# Patient Record
Sex: Female | Born: 1950 | Race: White | Hispanic: No | Marital: Married | Smoking: Never smoker
Health system: Southern US, Community
[De-identification: ages and names within clinical notes are randomized; demographics above are authoritative.]

## PROBLEM LIST (undated history)

## (undated) DIAGNOSIS — Z135 Encounter for screening for eye and ear disorders: Secondary | ICD-10-CM

## (undated) DIAGNOSIS — T7840XA Allergy, unspecified, initial encounter: Secondary | ICD-10-CM

## (undated) DIAGNOSIS — K2100 Gastro-esophageal reflux disease with esophagitis, without bleeding: Secondary | ICD-10-CM

## (undated) DIAGNOSIS — D649 Anemia, unspecified: Secondary | ICD-10-CM

## (undated) DIAGNOSIS — Z6835 Body mass index (BMI) 35.0-35.9, adult: Secondary | ICD-10-CM

## (undated) DIAGNOSIS — Z8489 Family history of other specified conditions: Secondary | ICD-10-CM

## (undated) DIAGNOSIS — E7800 Pure hypercholesterolemia, unspecified: Secondary | ICD-10-CM

## (undated) DIAGNOSIS — A6000 Herpesviral infection of urogenital system, unspecified: Secondary | ICD-10-CM

## (undated) DIAGNOSIS — M1990 Unspecified osteoarthritis, unspecified site: Secondary | ICD-10-CM

## (undated) DIAGNOSIS — E119 Type 2 diabetes mellitus without complications: Secondary | ICD-10-CM

## (undated) DIAGNOSIS — E669 Obesity, unspecified: Secondary | ICD-10-CM

## (undated) DIAGNOSIS — J309 Allergic rhinitis, unspecified: Secondary | ICD-10-CM

## (undated) DIAGNOSIS — I10 Essential (primary) hypertension: Secondary | ICD-10-CM

## (undated) HISTORY — PX: TUBAL LIGATION: SHX77

## (undated) HISTORY — DX: Encounter for screening for eye and ear disorders: Z13.5

## (undated) HISTORY — DX: Anemia, unspecified: D64.9

## (undated) HISTORY — DX: Body mass index (BMI) 35.0-35.9, adult: Z68.35

## (undated) HISTORY — PX: GASTRIC BYPASS: SHX52

## (undated) HISTORY — DX: Allergy, unspecified, initial encounter: T78.40XA

## (undated) HISTORY — PX: CHOLECYSTECTOMY: SHX55

## (undated) HISTORY — DX: Obesity, unspecified: E66.9

## (undated) HISTORY — DX: Essential (primary) hypertension: I10

## (undated) HISTORY — PX: COLONOSCOPY: SHX174

## (undated) HISTORY — DX: Allergic rhinitis, unspecified: J30.9

## (undated) HISTORY — DX: Gastro-esophageal reflux disease with esophagitis, without bleeding: K21.00

## (undated) HISTORY — DX: Pure hypercholesterolemia, unspecified: E78.00

## (undated) HISTORY — PX: ABDOMINAL HYSTERECTOMY: SHX81

## (undated) HISTORY — DX: Gastro-esophageal reflux disease with esophagitis: K21.0

## (undated) HISTORY — DX: Herpesviral infection of urogenital system, unspecified: A60.00

## (undated) HISTORY — DX: Type 2 diabetes mellitus without complications: E11.9

---

## 1999-08-22 ENCOUNTER — Encounter: Payer: Self-pay | Admitting: Family Medicine

## 1999-08-22 ENCOUNTER — Encounter: Admission: RE | Admit: 1999-08-22 | Discharge: 1999-08-22 | Payer: Self-pay | Admitting: Family Medicine

## 1999-09-06 ENCOUNTER — Encounter: Payer: Self-pay | Admitting: Obstetrics and Gynecology

## 1999-09-10 ENCOUNTER — Inpatient Hospital Stay (HOSPITAL_COMMUNITY): Admission: RE | Admit: 1999-09-10 | Discharge: 1999-09-13 | Payer: Self-pay | Admitting: Obstetrics and Gynecology

## 1999-09-10 ENCOUNTER — Encounter: Payer: Self-pay | Admitting: Obstetrics and Gynecology

## 1999-09-23 ENCOUNTER — Inpatient Hospital Stay (HOSPITAL_COMMUNITY): Admission: AD | Admit: 1999-09-23 | Discharge: 1999-09-23 | Payer: Self-pay | Admitting: Obstetrics and Gynecology

## 2001-01-19 ENCOUNTER — Encounter: Payer: Self-pay | Admitting: Family Medicine

## 2001-01-19 ENCOUNTER — Encounter: Admission: RE | Admit: 2001-01-19 | Discharge: 2001-01-19 | Payer: Self-pay | Admitting: Family Medicine

## 2001-12-28 ENCOUNTER — Encounter: Admission: RE | Admit: 2001-12-28 | Discharge: 2002-03-28 | Payer: Self-pay | Admitting: Family Medicine

## 2002-02-10 ENCOUNTER — Encounter: Admission: RE | Admit: 2002-02-10 | Discharge: 2002-05-11 | Payer: Self-pay | Admitting: Family Medicine

## 2002-11-18 ENCOUNTER — Ambulatory Visit (HOSPITAL_COMMUNITY): Admission: RE | Admit: 2002-11-18 | Discharge: 2002-11-18 | Payer: Self-pay | Admitting: *Deleted

## 2004-02-26 ENCOUNTER — Emergency Department (HOSPITAL_COMMUNITY): Admission: EM | Admit: 2004-02-26 | Discharge: 2004-02-26 | Payer: Self-pay | Admitting: Emergency Medicine

## 2006-03-26 ENCOUNTER — Encounter: Admission: RE | Admit: 2006-03-26 | Discharge: 2006-03-26 | Payer: Self-pay | Admitting: Family Medicine

## 2012-05-27 ENCOUNTER — Encounter: Payer: Self-pay | Admitting: Gastroenterology

## 2012-06-07 ENCOUNTER — Encounter: Payer: Self-pay | Admitting: Gastroenterology

## 2012-06-15 ENCOUNTER — Encounter: Payer: Self-pay | Admitting: Gastroenterology

## 2012-06-15 ENCOUNTER — Ambulatory Visit (INDEPENDENT_AMBULATORY_CARE_PROVIDER_SITE_OTHER): Payer: BC Managed Care – PPO | Admitting: Gastroenterology

## 2012-06-15 VITALS — BP 118/82 | HR 69 | Ht 60.0 in | Wt 186.4 lb

## 2012-06-15 DIAGNOSIS — K921 Melena: Secondary | ICD-10-CM

## 2012-06-15 MED ORDER — MOVIPREP 100 G PO SOLR: 1.0000 | Freq: Once | ORAL | Status: DC

## 2012-06-15 NOTE — Progress Notes (Signed)
HPI: This is a   very pleasant 62 year old woman whom I am meeting for the first time today.  Routine physical recently, home stool test was POSITIVE for blood.  She sees no overt bleeding.  No significant changes in her bowels.  Has 1-2 loose BMs a day (diet related).  Probably had colonoscopy 2004 [place], she really dosean't recal it actually.  Doesn't recall any polyps or signficant findings.   Had bariatric surgery years ago, same time cholecystectomy.  Sounds like g bypass.  She thinks she has hemorrhoids.  Review of systems: Pertinent positive and negative review of systems were noted in the above HPI section. Complete review of systems was performed and was otherwise normal.    Past Medical History  Diagnosis Date  . Screening for other eye conditions   . Other genital herpes   . Body mass index 35.0-35.9, adult   . Type II or unspecified type diabetes mellitus without mention of complication, not stated as uncontrolled   . Reflux esophagitis   . Allergic rhinitis, cause unspecified   . Obesity, unspecified   . Pure hypercholesterolemia   . Essential hypertension, benign     Past Surgical History  Procedure Laterality Date  . Abdominal hysterectomy      2001  . Cholecystectomy      1986  . Gastric bypass      1986  . Cesarean section      [id number]    Current Outpatient Prescriptions  Medication Sig Dispense Refill  . amLODipine (NORVASC) 10 MG tablet Take 10 mg by mouth daily.      Marland Kitchen aspirin 81 MG chewable tablet Chew 81 mg by mouth daily.      Marland Kitchen atorvastatin (LIPITOR) 80 MG tablet Take 80 mg by mouth daily.      . fluticasone (FLONASE) 50 MCG/ACT nasal spray Place 2 sprays into the nose daily.      Marland Kitchen ibuprofen (ADVIL,MOTRIN) 200 MG tablet Take 200 mg by mouth every 6 (six) hours as needed for pain.      Marland Kitchen lisinopril (PRINIVIL,ZESTRIL) 10 MG tablet Take 10 mg by mouth daily.      . metFORMIN (GLUCOPHAGE) 1000 MG tablet Take 1,000 mg by mouth 2 (two) times  daily with a meal.      . Multiple Vitamin (MULTIVITAMIN) capsule Take 1 capsule by mouth daily.      . ranitidine (ZANTAC) 75 MG tablet Take 75 mg by mouth 2 (two) times daily.      . valACYclovir (VALTREX) 1000 MG tablet Take 1,000 mg by mouth 2 (two) times daily.      . carbonyl iron ([employer] IRON) 45 MG TABS Take by mouth.       No current facility-administered medications for this visit.    Allergies as of 06/15/2012 - Review Complete 06/15/2012  Allergen Reaction Noted  . Codeine  06/15/2012    Family History  Problem Relation Age of Onset  . Diabetes Mother   . CVA Father   . Emphysema Father   . COPD Mother     History   Social History  . Marital Status: Married    Spouse Name: N/A    Number of Children: 2  . Years of Education: N/A   Occupational History  .     Social History Main Topics  . Smoking status: Never Smoker   . Smokeless tobacco: Never Used  . Alcohol Use: Yes     Comment: rare  . Drug Use: No  .  Sexually Active: Not on file   Other Topics Concern  . Not on file   Social History Narrative  . No narrative on file       Physical Exam: BP 118/82  Pulse 69  Ht 5' (1.524 m)  Wt 186 lb 6.4 oz (84.55 kg)  BMI 36.4 kg/m2 Constitutional: generally well-appearing Psychiatric: alert and oriented x3 Eyes: extraocular movements intact Mouth: oral pharynx moist, no lesions Neck: supple no lymphadenopathy Cardiovascular: heart regular rate and rhythm Lungs: clear to auscultation bilaterally Abdomen: soft, nontender, nondistended, no obvious ascites, no peritoneal signs, normal bowel sounds Extremities: no lower extremity edema bilaterally Skin: no lesions on visible extremities    Assessment and plan: 62 y.o. female with  Hemoccult-positive stool  We will attempt to get her records from [facility] gastroenterology her previous colonoscopy. It was probably in 2004. I will schedule her for colonoscopy for Hemoccult-positive stool at her soonest  convenience. I see no reason for any further blood tests or imaging studies prior to that.

## 2012-06-15 NOTE — Patient Instructions (Addendum)
We will get records from [facility] GI (previous colonoscopy, pathology reports). You will be set up for a colonoscopy (LEC, moderate sedation) for hemocult positive stool.                                               We are excited to introduce MyChart, a new best-in-class service that provides you online access to important information in your electronic medical record. We want to make it easier for you to view your health information - all in one secure location - when and where you need it. We expect MyChart will enhance the quality of care and service we provide.  When you register for MyChart, you can:    View your test results.    Request appointments and receive appointment reminders via email.    Request medication renewals.    View your medical history, allergies, medications and immunizations.    Communicate with your physician's office through a password-protected site.    Conveniently print information such as your medication lists.  To find out if MyChart is right for you, please talk to a member of our clinical staff today. We will gladly answer your questions about this free health and wellness tool.  If you are age 61 or older and want a member of your family to have access to your record, you must provide written consent by completing a proxy form available at our office. Please speak to our clinical staff about guidelines regarding accounts for patients younger than age 12.  As you activate your MyChart account and need any technical assistance, please call the MyChart technical support line at [phone]) or email your question to [email or web address]. If you email your question(s), please include your name, a return phone number and the best time to reach you.  If you have non-urgent health-related questions, you can send a message to our office through MyChart at [email or web address]. If you have a medical emergency, call 911.  Thank you for  using MyChart as your new health and wellness resource!   MyChart licensed from [employer],  [id number]. Patents Pending.

## 2012-06-18 ENCOUNTER — Telehealth: Payer: Self-pay | Admitting: Gastroenterology

## 2012-06-18 NOTE — Telephone Encounter (Signed)
Never an [facility] patient

## 2012-07-20 ENCOUNTER — Telehealth: Payer: Self-pay | Admitting: Gastroenterology

## 2012-07-20 NOTE — Telephone Encounter (Signed)
Pt stated "Patty" had already called her and answered her questions.

## 2012-07-20 NOTE — Telephone Encounter (Signed)
Pt wanted to make sure she could have hard candy if she feels her sugar drop.  Pt was advised it is ok to have hard candy and to take her glucose level on a regular basis, pt to call with any further questions or concerns

## 2012-07-21 ENCOUNTER — Ambulatory Visit (AMBULATORY_SURGERY_CENTER): Payer: BC Managed Care – PPO | Admitting: Gastroenterology

## 2012-07-21 ENCOUNTER — Encounter: Payer: Self-pay | Admitting: Gastroenterology

## 2012-07-21 VITALS — BP 126/75 | HR 63 | Temp 98.0°F | Resp 18 | Ht 60.0 in | Wt 186.0 lb

## 2012-07-21 DIAGNOSIS — K921 Melena: Secondary | ICD-10-CM

## 2012-07-21 DIAGNOSIS — D126 Benign neoplasm of colon, unspecified: Secondary | ICD-10-CM

## 2012-07-21 MED ORDER — SODIUM CHLORIDE 0.9 % IV SOLN: 500.0000 mL | INTRAVENOUS | Status: DC

## 2012-07-21 NOTE — Op Note (Signed)
 [facility] [address] [place], [address]   COLONOSCOPY PROCEDURE REPORT  PATIENT: Ashley Collier, Ashley Collier  MR#: [id number] BIRTHDATE: Jul 07, 1950 , 61  yrs. old GENDER: Female ENDOSCOPIST: Rachael Fee, MD REFERRED XB:JYNWG Bouska, M.D. PROCEDURE DATE:  07/21/2012 PROCEDURE:   Colonoscopy with snare polypectomy ASA CLASS:   Class II INDICATIONS:FOBT positive stool. MEDICATIONS: Fentanyl 50 mcg IV, Versed 5 mg IV, and These medications were titrated to patient response per physician's verbal order  DESCRIPTION OF PROCEDURE:   After the risks benefits and alternatives of the procedure were thoroughly explained, informed consent was obtained.  A digital rectal exam revealed no abnormalities of the rectum.   The LB [id number]-[id number]  endoscope was introduced through the anus and advanced to the cecum, which was identified by both the appendix and ileocecal valve. No adverse events experienced.   The quality of the prep was good.  The instrument was then slowly withdrawn as the colon was fully examined.  COLON FINDINGS: One small polyp was found, removed and sent to pathology.  This was 3mm across, sessile, located in sigmoid segment, removed with cold snare.  The examination was otherwise normal.  Retroflexed views revealed no abnormalities. The time to cecum=1 minutes 57 seconds.  Withdrawal time=6 minutes 43 seconds. The scope was withdrawn and the procedure completed. COMPLICATIONS: There were no complications.  ENDOSCOPIC IMPRESSION: One small polyp was found, removed and sent to pathology. The examination was otherwise normal.  RECOMMENDATIONS: If the polyp(s) removed today are proven to be adenomatous (pre-cancerous) polyps, you will need a repeat colonoscopy in 5 years.  Otherwise you should continue to follow colorectal cancer screening guidelines for "routine risk" patients with colonoscopy in 10 years.  You will receive a letter within 1-2 weeks with  the results of your biopsy as well as final recommendations.  Please call my office if you have not received a letter after 3 weeks.   eSigned:  Rachael Fee, MD 07/21/2012 2:39 PM

## 2012-07-21 NOTE — Progress Notes (Signed)
Patient did not experience any of the following events: a burn prior to discharge; a fall within the facility; wrong site/side/patient/procedure/implant event; or a hospital transfer or hospital admission upon discharge from the facility. (G8907) Patient did not have preoperative order for IV antibiotic SSI prophylaxis. (G8918)  

## 2012-07-21 NOTE — Patient Instructions (Signed)
Colon polyp x 1 removed and sent to pathology. Polyp handout given. Resume current medications. Blood sugar was 91. Call us with any questions or concerns. Thank you!   YOU HAD AN ENDOSCOPIC PROCEDURE TODAY AT THE [facility]: Refer to the procedure report that was given to you for any specific questions about what was found during the examination.  If the procedure report does not answer your questions, please call your gastroenterologist to clarify.  If you requested that your care partner not be given the details of your procedure findings, then the procedure report has been included in a sealed envelope for you to review at your convenience later.  YOU SHOULD EXPECT: Some feelings of bloating in the abdomen. Passage of more gas than usual.  Walking can help get rid of the air that was put into your GI tract during the procedure and reduce the bloating. If you had a lower endoscopy (such as a colonoscopy or flexible sigmoidoscopy) you may notice spotting of blood in your stool or on the toilet paper. If you underwent a bowel prep for your procedure, then you may not have a normal bowel movement for a few days.  DIET: Your first meal following the procedure should be a light meal and then it is ok to progress to your normal diet.  A half-sandwich or bowl of soup is an example of a good first meal.  Heavy or fried foods are harder to digest and may make you feel nauseous or bloated.  Likewise meals heavy in dairy and vegetables can cause extra gas to form and this can also increase the bloating.  Drink plenty of fluids but you should avoid alcoholic beverages for 24 hours.  ACTIVITY: Your care partner should take you home directly after the procedure.  You should plan to take it easy, moving slowly for the rest of the day.  You can resume normal activity the day after the procedure however you should NOT DRIVE or use heavy machinery for 24 hours (because of the sedation medicines used during  the test).    SYMPTOMS TO REPORT IMMEDIATELY: A gastroenterologist can be reached at any hour.  During normal business hours, 8:30 AM to 5:00 PM Monday through Friday, call [phone].  After hours and on weekends, please call the GI answering service at [phone] who will take a message and have the physician on call contact you.   Following lower endoscopy (colonoscopy or flexible sigmoidoscopy):  Excessive amounts of blood in the stool  Significant tenderness or worsening of abdominal pains  Swelling of the abdomen that is new, acute  Fever of 100F or higher  Following upper endoscopy (EGD)  Vomiting of blood or coffee ground material  New chest pain or pain under the shoulder blades  Painful or persistently difficult swallowing  New shortness of breath  Fever of 100F or higher  Black, tarry-looking stools  FOLLOW UP: If any biopsies were taken you will be contacted by phone or by letter within the next 1-3 weeks.  Call your gastroenterologist if you have not heard about the biopsies in 3 weeks.  Our staff will call the home number listed on your records the next business day following your procedure to check on you and address any questions or concerns that you may have at that time regarding the information given to you following your procedure. This is a courtesy call and so if there is no answer at the home number and we have  heard from you through the emergency physician on call, we will assume that you have returned to your regular daily activities without incident.  SIGNATURES/CONFIDENTIALITY: You and/or your care partner have signed paperwork which will be entered into your electronic medical record.  These signatures attest to the fact that that the information above on your After Visit Summary has been reviewed and is understood.  Full responsibility of the confidentiality of this discharge information lies with you and/or your care-partner. 

## 2012-07-22 ENCOUNTER — Telehealth: Payer: Self-pay | Admitting: *Deleted

## 2012-07-22 ENCOUNTER — Other Ambulatory Visit: Payer: Self-pay | Admitting: Gastroenterology

## 2012-07-22 NOTE — Telephone Encounter (Signed)
  Follow up Call-  Call back number 07/21/2012  Post procedure Call Back phone  # [phone]  Permission to leave phone message Yes     Patient questions:  Do you have a fever, pain , or abdominal swelling? no Pain Score  0 *  Have you tolerated food without any problems? yes  Have you been able to return to your normal activities? no  Do you have any questions about your discharge instructions: Diet   no Medications  no Follow up visit  no  Do you have questions or concerns about your Care? no  Actions: * If pain score is 4 or above: No action needed, pain <4.  She still feels drowsy and unable to drive.  States Dr. Algis Downs her not to go to work today, secondary to sedation.  Otherwise she is feeling well.

## 2012-07-28 ENCOUNTER — Encounter: Payer: Self-pay | Admitting: Gastroenterology

## 2013-07-11 ENCOUNTER — Emergency Department (HOSPITAL_COMMUNITY): Payer: BC Managed Care – PPO

## 2013-07-11 ENCOUNTER — Emergency Department (HOSPITAL_COMMUNITY)
Admission: EM | Admit: 2013-07-11 | Discharge: 2013-07-11 | Disposition: A | Discharge status: Home / Self Care | Payer: BC Managed Care – PPO | Attending: Emergency Medicine | Admitting: Emergency Medicine

## 2013-07-11 ENCOUNTER — Encounter (HOSPITAL_COMMUNITY): Payer: Self-pay | Admitting: Emergency Medicine

## 2013-07-11 DIAGNOSIS — E7800 Pure hypercholesterolemia, unspecified: Secondary | ICD-10-CM | POA: Insufficient documentation

## 2013-07-11 DIAGNOSIS — Z8709 Personal history of other diseases of the respiratory system: Secondary | ICD-10-CM | POA: Insufficient documentation

## 2013-07-11 DIAGNOSIS — IMO0002 Reserved for concepts with insufficient information to code with codable children: Secondary | ICD-10-CM | POA: Insufficient documentation

## 2013-07-11 DIAGNOSIS — Z79899 Other long term (current) drug therapy: Secondary | ICD-10-CM | POA: Insufficient documentation

## 2013-07-11 DIAGNOSIS — I10 Essential (primary) hypertension: Secondary | ICD-10-CM | POA: Insufficient documentation

## 2013-07-11 DIAGNOSIS — Z6835 Body mass index (BMI) 35.0-35.9, adult: Secondary | ICD-10-CM | POA: Insufficient documentation

## 2013-07-11 DIAGNOSIS — Z7982 Long term (current) use of aspirin: Secondary | ICD-10-CM | POA: Insufficient documentation

## 2013-07-11 DIAGNOSIS — Z8619 Personal history of other infectious and parasitic diseases: Secondary | ICD-10-CM | POA: Insufficient documentation

## 2013-07-11 DIAGNOSIS — Z9889 Other specified postprocedural states: Secondary | ICD-10-CM | POA: Insufficient documentation

## 2013-07-11 DIAGNOSIS — Z90710 Acquired absence of both cervix and uterus: Secondary | ICD-10-CM | POA: Insufficient documentation

## 2013-07-11 DIAGNOSIS — R197 Diarrhea, unspecified: Secondary | ICD-10-CM | POA: Insufficient documentation

## 2013-07-11 DIAGNOSIS — K439 Ventral hernia without obstruction or gangrene: Secondary | ICD-10-CM | POA: Insufficient documentation

## 2013-07-11 DIAGNOSIS — E119 Type 2 diabetes mellitus without complications: Secondary | ICD-10-CM | POA: Insufficient documentation

## 2013-07-11 LAB — CBC WITH DIFFERENTIAL/PLATELET
Basophils Absolute: 0 10*3/uL (ref 0.0–0.1)
Basophils Relative: 0 % (ref 0–1)
Eosinophils Absolute: 0 10*3/uL (ref 0.0–0.7)
Eosinophils Relative: 0 % (ref 0–5)
HCT: 35.4 % — ABNORMAL LOW (ref 36.0–46.0)
Hemoglobin: 11.6 g/dL — ABNORMAL LOW (ref 12.0–15.0)
Lymphocytes Relative: 14 % (ref 12–46)
Lymphs Abs: 1.3 10*3/uL (ref 0.7–4.0)
MCH: 27 pg (ref 26.0–34.0)
MCHC: 32.8 g/dL (ref 30.0–36.0)
MCV: 82.5 fL (ref 78.0–100.0)
Monocytes Absolute: 0.3 10*3/uL (ref 0.1–1.0)
Monocytes Relative: 3 % (ref 3–12)
Neutro Abs: 7.8 10*3/uL — ABNORMAL HIGH (ref 1.7–7.7)
Neutrophils Relative %: 83 % — ABNORMAL HIGH (ref 43–77)
Platelets: 269 10*3/uL (ref 150–400)
RBC: 4.29 MIL/uL (ref 3.87–5.11)
RDW: 13.2 % (ref 11.5–15.5)
WBC: 9.4 10*3/uL (ref 4.0–10.5)

## 2013-07-11 LAB — URINALYSIS, ROUTINE W REFLEX MICROSCOPIC
Bilirubin Urine: NEGATIVE
Glucose, UA: NEGATIVE mg/dL
Hgb urine dipstick: NEGATIVE
Ketones, ur: 15 mg/dL — AB
Leukocytes, UA: NEGATIVE
Nitrite: NEGATIVE
Protein, ur: NEGATIVE mg/dL
Specific Gravity, Urine: 1.012 (ref 1.005–1.030)
Urobilinogen, UA: 0.2 mg/dL (ref 0.0–1.0)
pH: 8 (ref 5.0–8.0)

## 2013-07-11 LAB — COMPREHENSIVE METABOLIC PANEL
ALT: 21 U/L (ref 0–35)
AST: 37 U/L (ref 0–37)
Albumin: 3.8 g/dL (ref 3.5–5.2)
Alkaline Phosphatase: 82 U/L (ref 39–117)
BUN: 15 mg/dL (ref 6–23)
CO2: 21 mEq/L (ref 19–32)
Calcium: 9.4 mg/dL (ref 8.4–10.5)
Chloride: 100 mEq/L (ref 96–112)
Creatinine, Ser: 0.35 mg/dL — ABNORMAL LOW (ref 0.50–1.10)
GFR calc Af Amer: >90 mL/min (ref >90)
GFR calc non Af Amer: >90 mL/min (ref >90)
Glucose, Bld: 110 mg/dL — ABNORMAL HIGH (ref 70–99)
Potassium: 5.5 mEq/L — ABNORMAL HIGH (ref 3.7–5.3)
Sodium: 138 mEq/L (ref 137–147)
Total Bilirubin: 0.3 mg/dL (ref 0.3–1.2)
Total Protein: 6.7 g/dL (ref 6.0–8.3)

## 2013-07-11 LAB — LIPASE, BLOOD: Lipase: 28 U/L (ref 11–59)

## 2013-07-11 MED ORDER — IOHEXOL 300 MG/ML  SOLN: 80.0000 mL | Freq: Once | INTRAMUSCULAR | Status: DC | PRN

## 2013-07-11 MED ORDER — IOHEXOL 300 MG/ML  SOLN
25.0000 mL | INTRAMUSCULAR | Status: AC
  Administered 2013-07-11: 25 mL via ORAL

## 2013-07-11 NOTE — ED Notes (Signed)
Pt sent from PCP for eval of lower abd pain with some diarrhea; pt sts umbilical hernia is larger than normal; pt sent for further eval

## 2013-07-11 NOTE — ED Notes (Signed)
Pt had some lab work at PCP that they sent with her

## 2013-07-11 NOTE — ED Notes (Signed)
Pt to CT scan.

## 2013-07-11 NOTE — Discharge Instructions (Signed)
Follow up with [facility] for further evaluation of your hernia. Return to the ED with worsening or concerning symptoms. Refer to attached documents for more information.

## 2013-07-11 NOTE — ED Provider Notes (Signed)
CSN: [id number]     Arrival date & time 07/11/13  1022 History   First MD Initiated Contact with Patient 07/11/13 1046     Chief Complaint  Patient presents with  . Abdominal Pain     (Consider location/radiation/quality/duration/timing/severity/associated sxs/prior Treatment) HPI Comments: Patient is a 63 year old female with a past medical history of diabetes, hypertension, previous abdominal hysterectomy, gastric bypass, and cholecystectomy who presents with abdominal pain that started suddenly this morning. The pain is located in her periumbilical area and does not radiate. The pain is described as aching and severe. The pain started suddenly and progressively worsened since the onset. No alleviating factors. Palpation of the area makes the pain worse. The patient has tried nothing for symptoms without relief. Associated symptoms include diarrhea. Patient went to her PCP who sent her to the ED for further evaluation.  Patient denies fever, headache, nausea, vomiting, chest pain, SOB, dysuria, constipation, abnormal vaginal bleeding/discharge.      Past Medical History  Diagnosis Date  . Screening for other eye conditions   . Other genital herpes   . Body mass index 35.0-35.9, adult   . Type II or unspecified type diabetes mellitus without mention of complication, not stated as uncontrolled   . Reflux esophagitis   . Allergic rhinitis, cause unspecified   . Obesity, unspecified   . Pure hypercholesterolemia   . Essential hypertension, benign    Past Surgical History  Procedure Laterality Date  . Abdominal hysterectomy      2001  . Cholecystectomy      1986  . Gastric bypass      1986  . Cesarean section      [id number]   Family History  Problem Relation Age of Onset  . Diabetes Mother   . CVA Father   . Emphysema Father   . COPD Mother    History  Substance Use Topics  . Smoking status: Never Smoker   . Smokeless tobacco: Never Used  . Alcohol Use: Yes     Comment:  rare   OB History   Grav Para Term Preterm Abortions TAB SAB Ect Mult Living                 Review of Systems  Constitutional: Negative for fever, chills and fatigue.  HENT: Negative for trouble swallowing.   Eyes: Negative for visual disturbance.  Respiratory: Negative for shortness of breath.   Cardiovascular: Negative for chest pain and palpitations.  Gastrointestinal: Positive for abdominal pain. Negative for nausea, vomiting and diarrhea.  Genitourinary: Negative for dysuria and difficulty urinating.  Musculoskeletal: Negative for arthralgias and neck pain.  Skin: Negative for color change.  Neurological: Negative for dizziness and weakness.  Psychiatric/Behavioral: Negative for dysphoric mood.      Allergies  Codeine  Home Medications   Current Outpatient Rx  Name  Route  Sig  Dispense  Refill  . amLODipine (NORVASC) 10 MG tablet   Oral   Take 10 mg by mouth daily.         Marland Kitchen aspirin EC 81 MG tablet   Oral   Take 81 mg by mouth daily.         Marland Kitchen atorvastatin (LIPITOR) 80 MG tablet   Oral   Take 80 mg by mouth at bedtime.          . cyclobenzaprine (FLEXERIL) 10 MG tablet   Oral   Take 10 mg by mouth at bedtime as needed for muscle spasms.          Marland Kitchen  DiphenhydrAMINE HCl (ALLERGY MEDICATION PO)   Oral   Take 1 tablet by mouth daily.         . fluticasone (FLONASE) 50 MCG/ACT nasal spray   Each Nare   Place 1 spray into both nostrils daily.          Marland Kitchen lisinopril (PRINIVIL,ZESTRIL) 10 MG tablet   Oral   Take 10 mg by mouth daily.         . meloxicam (MOBIC) 15 MG tablet   Oral   Take 15 mg by mouth at bedtime.          . metFORMIN (GLUCOPHAGE) 1000 MG tablet   Oral   Take 1,000 mg by mouth 2 (two) times daily with a meal.         . Multiple Vitamin (MULTIVITAMIN) capsule   Oral   Take 1 capsule by mouth daily.         Marland Kitchen omeprazole (PRILOSEC) 20 MG capsule   Oral   Take 20 mg by mouth at bedtime.         . valACYclovir  (VALTREX) 1000 MG tablet   Oral   Take 1,000 mg by mouth 2 (two) times daily as needed (cold sores).           BP 123/66  Pulse 78  Temp(Src) 98.1 F (36.7 C) (Oral)  Resp 17  SpO2 96% Physical Exam  Nursing note and vitals reviewed. Constitutional: She is oriented to person, place, and time. She appears well-developed and well-nourished. No distress.  HENT:  Head: Normocephalic and atraumatic.  Eyes: Conjunctivae and EOM are normal.  Neck: Normal range of motion.  Cardiovascular: Normal rate and regular rhythm.  Exam reveals no gallop and no friction rub.   No murmur heard. Pulmonary/Chest: Effort normal and breath sounds normal. She has no wheezes. She has no rales. She exhibits no tenderness.  Abdominal: Soft. She exhibits no distension. There is tenderness. There is no rebound and no guarding.  Periumbilical tenderness to palpation on the right. Possible hernia palpated in the area of tenderness.   Musculoskeletal: Normal range of motion.  Neurological: She is alert and oriented to person, place, and time. Coordination normal.  Speech is goal-oriented. Moves limbs without ataxia.   Skin: Skin is warm and dry.  Psychiatric: She has a normal mood and affect. Her behavior is normal.    ED Course  Procedures (including critical care time) Labs Review Labs Reviewed  CBC WITH DIFFERENTIAL - Abnormal; Notable for the following:    Hemoglobin 11.6 (*)    HCT 35.4 (*)    Neutrophils Relative % 83 (*)    Neutro Abs 7.8 (*)    All other components within normal limits  COMPREHENSIVE METABOLIC PANEL - Abnormal; Notable for the following:    Potassium 5.5 (*)    Glucose, Bld 110 (*)    Creatinine, Ser 0.35 (*)    All other components within normal limits  URINALYSIS, ROUTINE W REFLEX MICROSCOPIC - Abnormal; Notable for the following:    Ketones, ur 15 (*)    All other components within normal limits  LIPASE, BLOOD   Imaging Review Ct Abdomen Pelvis W Contrast  07/11/2013    CLINICAL DATA:  Ventral hernia. Abdominal bulging. Periumbilical pain.  EXAM: CT ABDOMEN AND PELVIS WITH CONTRAST  TECHNIQUE: Multidetector CT imaging of the abdomen and pelvis was performed using the standard protocol following bolus administration of intravenous contrast.  CONTRAST:  80 mL of Omnipaque 300.  COMPARISON:  CT of the abdomen and pelvis 01/06/2012.  FINDINGS: Lung Bases: Several tiny pulmonary nodules are noted in the lung bases bilaterally, largest of which measures 4 mm in the left lower lobe (image 6 of series 3), which appear unchanged in retrospect compared to prior study from 01/06/2012. Severe mitral annular calcifications inferiorly.  Abdomen/Pelvis: Status post cholecystectomy. Calcification in segment 8 of the liver likely represents a calcified granuloma. No other focal hepatic lesions. The appearance of the pancreas, spleen, bilateral adrenal glands and bilateral kidneys is unremarkable. Postoperative changes of gastric bypass procedure. Atherosclerosis throughout the abdominal and pelvic vasculature, without evidence of aneurysm or dissection. No significant volume of ascites. No pneumoperitoneum. No pathologic distention of small bowel. No definite lymphadenopathy identified within the abdomen or pelvis. Status post hysterectomy. Ovaries are not confidently identified may be surgically absent or atrophic. Urinary bladder is unremarkable in appearance.  Small supraumbilical ventral hernia slightly to the right of midline containing only omental fat.  Musculoskeletal: There are no aggressive appearing lytic or blastic lesions noted in the visualized portions of the skeleton.  IMPRESSION: 1. There is a small supraumbilical ventral hernia, however, this contains only omental fat at this time. No associated bowel incarceration or obstruction is noted. 2. No acute findings in the abdomen or pelvis are otherwise identified. 3. Postoperative changes, as above. 4. Small pulmonary nodules in the  visualized lung bases bilaterally appear unchanged in retrospect compared to 01/06/2012, and measure 4 mm or less in size. These can be considered benign at this time and do not require specific imaging followup. This recommendation follows the consensus statement: Guidelines for Management of Small Pulmonary Nodules Detected on CT Scans: A Statement from the [employer] as published in Radiology 2005; 237:395-400.   Electronically Signed   By: Vinnie Langton M.D.   On: 07/11/2013 13:54     EKG Interpretation None      MDM   Final diagnoses:  Ventral hernia    1:47 PM Labs shows potassium at 5.5 likely due hemolysis and slight WBC elevation at 11.6. CT abdomen pelvis pending. Vitals stable and patient afebrile.   2:42 PM Patient's CT shows ventral hernia that is not incarcerated. Claiborne Billings, PA-C with general surgery contacted and states patient can be discharged and follow up as an outpatient. Patient has no pain at this time. Patient given return precautions. Vitals stable and patient afebrile.   Alvina Chou, PA-C 07/11/13 1455

## 2013-07-12 NOTE — ED Provider Notes (Signed)
Medical screening examination/treatment/procedure(s) were conducted as a shared visit with non-physician practitioner(s) and myself.  I personally evaluated the patient during the encounter.   EKG Interpretation None      Pt c/o crampy mid abdominal pain. No vomiting or abd distension. Had diarrheal stool earlier today. No fevers. +palp, non tender, non reducible ventral/periumbilical hernia, approx 6-7 cm diameter. No skin changes or erythema to area. Labs. Ct.  Ct results noted, gen surgery consulted. Recheck pt no pain, abd soft nt.   Mirna Mires, MD 07/12/13 [phone]

## 2013-07-19 ENCOUNTER — Ambulatory Visit (INDEPENDENT_AMBULATORY_CARE_PROVIDER_SITE_OTHER): Payer: BC Managed Care – PPO | Admitting: Surgery

## 2013-07-19 ENCOUNTER — Encounter (INDEPENDENT_AMBULATORY_CARE_PROVIDER_SITE_OTHER): Payer: Self-pay | Admitting: Surgery

## 2013-07-19 VITALS — BP 124/70 | HR 76 | Temp 97.5°F | Ht 60.0 in | Wt 167.8 lb

## 2013-07-19 DIAGNOSIS — K432 Incisional hernia without obstruction or gangrene: Secondary | ICD-10-CM | POA: Insufficient documentation

## 2013-07-19 NOTE — Progress Notes (Signed)
Patient ID: Ashley Collier, female   DOB: May 07, 1950, 63 y.o.   MRN: [id number]  Chief Complaint  Patient presents with  . ventral hernia    new pt    HPI Ashley Collier is a 63 y.o. female.  Referred by Dr. Bernerd Limbo for evaluation of ventral hernia HPI This is a 62 year old female who was recently in the emergency department for severe periumbilical pain and swelling. The patient has had a small ventral hernia around her umbilicus for the last 9 months. She was at work one morning when she noticed severe pain and swelling this area. They were able to reduce this in the emergency department. A CT scan showed that there was no bowel in the hernia. She is now referred for surgical evaluation. The patient works at [employer] at her job does require some lifting. She has been asymptomatic over the last several days.   Past Medical History  Diagnosis Date  . Screening for other eye conditions   . Other genital herpes   . Body mass index 35.0-35.9, adult   . Type II or unspecified type diabetes mellitus without mention of complication, not stated as uncontrolled   . Reflux esophagitis   . Allergic rhinitis, cause unspecified   . Obesity, unspecified   . Pure hypercholesterolemia   . Essential hypertension, benign     Past Surgical History  Procedure Laterality Date  . Abdominal hysterectomy      2001  . Cholecystectomy      1986  . Gastric bypass      1986  . Cesarean section      [id number]    Family History  Problem Relation Age of Onset  . Diabetes Mother   . CVA Father   . Emphysema Father   . COPD Mother     Social History History  Substance Use Topics  . Smoking status: Never Smoker   . Smokeless tobacco: Never Used  . Alcohol Use: Yes     Comment: rare    Allergies  Allergen Reactions  . Codeine Other (See Comments)    Hallucinations     Current Outpatient Prescriptions  Medication Sig Dispense Refill  . amLODipine (NORVASC) 10 MG tablet Take 10 mg by  mouth daily.      Marland Kitchen aspirin EC 81 MG tablet Take 81 mg by mouth daily.      Marland Kitchen atorvastatin (LIPITOR) 80 MG tablet Take 80 mg by mouth at bedtime.       . cyclobenzaprine (FLEXERIL) 10 MG tablet Take 10 mg by mouth at bedtime as needed for muscle spasms.       . DiphenhydrAMINE HCl (ALLERGY MEDICATION PO) Take 1 tablet by mouth daily.      . fluticasone (FLONASE) 50 MCG/ACT nasal spray Place 1 spray into both nostrils daily.       Marland Kitchen lisinopril (PRINIVIL,ZESTRIL) 10 MG tablet Take 10 mg by mouth daily.      . meloxicam (MOBIC) 15 MG tablet Take 15 mg by mouth at bedtime.       . metFORMIN (GLUCOPHAGE) 1000 MG tablet Take 1,000 mg by mouth 2 (two) times daily with a meal.      . Multiple Vitamin (MULTIVITAMIN) capsule Take 1 capsule by mouth daily.      Marland Kitchen omeprazole (PRILOSEC) 20 MG capsule Take 20 mg by mouth at bedtime.      . valACYclovir (VALTREX) 1000 MG tablet Take 1,000 mg by mouth 2 (two) times daily as needed (  cold sores).        No current facility-administered medications for this visit.    Review of Systems Review of Systems  Constitutional: Negative for fever, chills and unexpected weight change.  HENT: Negative for congestion, hearing loss, sore throat, trouble swallowing and voice change.   Eyes: Negative for visual disturbance.  Respiratory: Negative for cough and wheezing.   Cardiovascular: Negative for chest pain, palpitations and leg swelling.  Gastrointestinal: Positive for abdominal pain and abdominal distention. Negative for nausea, vomiting, diarrhea, constipation, blood in stool and anal bleeding.  Genitourinary: Negative for hematuria, vaginal bleeding and difficulty urinating.  Musculoskeletal: Negative for arthralgias.  Skin: Negative for rash and wound.  Neurological: Negative for seizures, syncope and headaches.  Hematological: Negative for adenopathy. Does not bruise/bleed easily.  Psychiatric/Behavioral: Negative for confusion.    Blood pressure 124/70,  pulse 76, temperature 97.5 F (36.4 C), height 5' (1.524 m), weight 167 lb 12.8 oz (76.114 kg).  Physical Exam Physical Exam WDWN in NAD HEENT:  EOMI, sclera anicteric Neck:  No masses, no thyromegaly Lungs:  CTA bilaterally; normal respiratory effort CV:  Regular rate and rhythm; no murmurs Abd:  +bowel sounds, soft, long midline incision Palpable mass to the right and just below the umbilicus Ext:  Well-perfused; no edema Skin:  Warm, dry; no sign of jaundice  Data Reviewed Ct Abdomen Pelvis W Contrast  07/11/2013   CLINICAL DATA:  Ventral hernia. Abdominal bulging. Periumbilical pain.  EXAM: CT ABDOMEN AND PELVIS WITH CONTRAST  TECHNIQUE: Multidetector CT imaging of the abdomen and pelvis was performed using the standard protocol following bolus administration of intravenous contrast.  CONTRAST:  80 mL of Omnipaque 300.  COMPARISON:  CT of the abdomen and pelvis 01/06/2012.  FINDINGS: Lung Bases: Several tiny pulmonary nodules are noted in the lung bases bilaterally, largest of which measures 4 mm in the left lower lobe (image 6 of series 3), which appear unchanged in retrospect compared to prior study from 01/06/2012. Severe mitral annular calcifications inferiorly.  Abdomen/Pelvis: Status post cholecystectomy. Calcification in segment 8 of the liver likely represents a calcified granuloma. No other focal hepatic lesions. The appearance of the pancreas, spleen, bilateral adrenal glands and bilateral kidneys is unremarkable. Postoperative changes of gastric bypass procedure. Atherosclerosis throughout the abdominal and pelvic vasculature, without evidence of aneurysm or dissection. No significant volume of ascites. No pneumoperitoneum. No pathologic distention of small bowel. No definite lymphadenopathy identified within the abdomen or pelvis. Status post hysterectomy. Ovaries are not confidently identified may be surgically absent or atrophic. Urinary bladder is unremarkable in appearance.   Small supraumbilical ventral hernia slightly to the right of midline containing only omental fat.  Musculoskeletal: There are no aggressive appearing lytic or blastic lesions noted in the visualized portions of the skeleton.  IMPRESSION: 1. There is a small supraumbilical ventral hernia, however, this contains only omental fat at this time. No associated bowel incarceration or obstruction is noted. 2. No acute findings in the abdomen or pelvis are otherwise identified. 3. Postoperative changes, as above. 4. Small pulmonary nodules in the visualized lung bases bilaterally appear unchanged in retrospect compared to 01/06/2012, and measure 4 mm or less in size. These can be considered benign at this time and do not require specific imaging followup. This recommendation follows the consensus statement: Guidelines for Management of Small Pulmonary Nodules Detected on CT Scans: A Statement from the [place] as published in Radiology 2005; 237:395-400.   Electronically Signed   By: Vinnie Langton  M.D.   On: 07/11/2013 13:54      Assessment    Ventral incisional hernia - containing only fat     Plan    Laparoscopic ventral hernia repair with mesh.  The surgical procedure has been discussed with the patient.  Potential risks, benefits, alternative treatments, and expected outcomes have been explained.  All of the patient's questions at this time have been answered.  The likelihood of reaching the patient's treatment goal is good.  The patient understand the proposed surgical procedure and wishes to proceed.         Imogene Burn. Randle Shatzer 07/19/2013, 4:07 PM

## 2013-11-13 ENCOUNTER — Ambulatory Visit (INDEPENDENT_AMBULATORY_CARE_PROVIDER_SITE_OTHER): Payer: BC Managed Care – PPO

## 2013-11-13 ENCOUNTER — Ambulatory Visit (INDEPENDENT_AMBULATORY_CARE_PROVIDER_SITE_OTHER): Payer: BC Managed Care – PPO | Admitting: Family Medicine

## 2013-11-13 VITALS — BP 128/80 | HR 89 | Temp 98.7°F | Resp 16 | Ht 60.0 in | Wt 163.4 lb

## 2013-11-13 DIAGNOSIS — M79609 Pain in unspecified limb: Secondary | ICD-10-CM

## 2013-11-13 DIAGNOSIS — M79674 Pain in right toe(s): Secondary | ICD-10-CM

## 2013-11-13 NOTE — Progress Notes (Signed)
Subjective:    Patient ID: Ashley Collier, female    DOB: 1951-02-10, 63 y.o.   MRN: [id number] This chart was scribed for Ashley Haber, MD by Steva Colder, ED Scribe. The patient was seen in room 11 at 2:21 PM.   Chief Complaint  Patient presents with  . Toe Injury    Possible broken toe, 2nd toe on right foot, very painful, x3 days    HPI HPI Comments: [name] is a 63 y.o. female who presents to the Emergency Department complaining of toe injury onset 3 days. She states that she fell and after she fell the toe was bent, she states that she pulled her toe up and then she felt pain. She states that her husband, who had a medical hx placed a splint on.  She states that she can put a shoe on , but can not walk on it.  She states that she thought she was just bruising from the fall. She states that she is able to sleep with her foot propped up. She states that she is having associated symptoms of joint pain. She states that she has tried Extra Strength Tylenol and icing her foot. She states that she has tried splinting her toe with no relief for her symptoms. She denies any other associated symptoms. She states that she works at [employer].   Patient Active Problem List   Diagnosis Date Noted  . Ventral incisional hernia 07/19/2013   Past Medical History  Diagnosis Date  . Screening for other eye conditions   . Other genital herpes   . Body mass index 35.0-35.9, adult   . Type II or unspecified type diabetes mellitus without mention of complication, not stated as uncontrolled   . Reflux esophagitis   . Allergic rhinitis, cause unspecified   . Obesity, unspecified   . Pure hypercholesterolemia   . Essential hypertension, benign   . Anemia   . Allergy    Past Surgical History  Procedure Laterality Date  . Abdominal hysterectomy      2001  . Cholecystectomy      1986  . Gastric bypass      1986  . Cesarean section      [id number]  . Tubal ligation     Allergies  Allergen  Reactions  . Codeine Other (See Comments)    Hallucinations    Prior to Admission medications   Medication Sig Start Date End Date Taking? Authorizing Provider  amLODipine (NORVASC) 10 MG tablet Take 10 mg by mouth daily.   Yes Historical Provider, MD  atorvastatin (LIPITOR) 80 MG tablet Take 80 mg by mouth at bedtime.    Yes Historical Provider, MD  cyclobenzaprine (FLEXERIL) 10 MG tablet Take 10 mg by mouth at bedtime as needed for muscle spasms.  07/04/13  Yes Historical Provider, MD  fluticasone (FLONASE) 50 MCG/ACT nasal spray Place 1 spray into both nostrils daily.    Yes Historical Provider, MD  lisinopril (PRINIVIL,ZESTRIL) 10 MG tablet Take 10 mg by mouth daily.   Yes Historical Provider, MD  meloxicam (MOBIC) 15 MG tablet Take 15 mg by mouth at bedtime.  07/08/13  Yes Historical Provider, MD  metFORMIN (GLUCOPHAGE) 1000 MG tablet Take 1,000 mg by mouth 2 (two) times daily with a meal.   Yes Historical Provider, MD  Multiple Vitamin (MULTIVITAMIN) capsule Take 1 capsule by mouth daily.   Yes Historical Provider, MD  omeprazole (PRILOSEC) 20 MG capsule Take 20 mg by mouth at bedtime.  Yes Historical Provider, MD  valACYclovir (VALTREX) 1000 MG tablet Take 1,000 mg by mouth 2 (two) times daily as needed (cold sores).    Yes Historical Provider, MD  aspirin EC 81 MG tablet Take 81 mg by mouth daily.    Historical Provider, MD  DiphenhydrAMINE HCl (ALLERGY MEDICATION PO) Take 1 tablet by mouth daily.    Historical Provider, MD      Review of Systems  Musculoskeletal: Positive for arthralgias (right second toe pain).     Objective:  Physical Exam  Nursing note and vitals reviewed. Constitutional: She is oriented to person, place, and time. She appears well-developed and well-nourished. No distress.  HENT:  Head: Normocephalic and atraumatic.  Eyes: EOM are normal.  Neck: Neck supple. No tracheal deviation present.  Cardiovascular: Normal rate.   Pulmonary/Chest: Effort normal.  No respiratory distress.  Musculoskeletal: Normal range of motion.  Ecchymosis of the second toe and the distal forefoot on dorsal aspect surface. No significant swelling.  Neurological: She is alert and oriented to person, place, and time.  Skin: Skin is warm and dry.  Psychiatric: She has a normal mood and affect. Her behavior is normal.   [facility] reading (PRIMARY) by  Dr. Joseph Art right second toe has slight irregularity distal tibial side second metatarsal..    BP 128/80  Pulse 89  Temp(Src) 98.7 F (37.1 C)  Resp 16  Ht 5' (1.524 m)  Wt 163 lb 6.4 oz (74.118 kg)  BMI 31.91 kg/m2  SpO2 99%  Assessment & Plan:  DIAGNOSTIC STUDIES: Oxygen Saturation is 99% on room air, normal by my interpretation.    COORDINATION OF CARE: 2:23 PM-Discussed treatment plan which includes X-Ray of the right foot with pt at bedside and pt agreed to plan.   1. Pain of toe of right foot     I personally performed the services described in this documentation, which was scribed in my presence. The recorded information has been reviewed and is accurate.  Pain of toe of right foot - Plan: DG Toe 2nd Right  Buddy tape Off work x 1 week  Ashley Haber, MD

## 2013-11-15 ENCOUNTER — Telehealth: Payer: Self-pay

## 2013-11-15 DIAGNOSIS — Z0271 Encounter for disability determination: Secondary | ICD-10-CM

## 2013-11-15 NOTE — Telephone Encounter (Signed)
Pt's FMLA ppw dropped off in Dr. Lenn Cal box for completion in 5-7 business days

## 2013-11-17 ENCOUNTER — Ambulatory Visit (INDEPENDENT_AMBULATORY_CARE_PROVIDER_SITE_OTHER): Payer: BC Managed Care – PPO | Admitting: Family Medicine

## 2013-11-17 VITALS — BP 120/68 | HR 87 | Temp 98.0°F | Resp 16 | Ht 59.0 in | Wt 162.3 lb

## 2013-11-17 DIAGNOSIS — S90121D Contusion of right lesser toe(s) without damage to nail, subsequent encounter: Secondary | ICD-10-CM

## 2013-11-17 DIAGNOSIS — Z5189 Encounter for other specified aftercare: Secondary | ICD-10-CM

## 2013-11-17 DIAGNOSIS — S90129A Contusion of unspecified lesser toe(s) without damage to nail, initial encounter: Secondary | ICD-10-CM

## 2013-11-17 NOTE — Telephone Encounter (Signed)
Advised pt to RTC to be sure she is able to return to work

## 2013-11-17 NOTE — Telephone Encounter (Signed)
Called patient to let her that [facility] is ready to be picked up. Patient also had a question about her return to work note. States that she is cleared to go back on the 24th. Wants to know if she has to RTC before she can go back Monday.  [phone] Jerilynn Mages)

## 2013-11-17 NOTE — Progress Notes (Signed)
Subjective:  This chart was scribed for Reginia Forts, MD by Mercy Moore, Medial Scribe. This patient was seen in room 3 and the patient's care was started at 7:18 PM.    Patient ID: Ashley Collier, female    DOB: 06/15/1950, 63 y.o.   MRN: [id number]  11/17/2013  Letter for School/Work   HPI HPI Comments: Elfrieda Espino is a 63 y.o. female who presents to the [facility] for updated letter permitting her return to work. Patient was seen on 8/16 by Dr. Joseph Art for right second toe injury originally splinted by her husband. Treatment with Tylenol. X-ray found to be negative. Injury sustained by tripping over an extension cord that wrapped around the toe. Patient reports notable deformity immediately after the injury.  Patient written out of work for one week. Patient works at [employer]. She reports significant improvement since her last visit and is ready to return to work 8/24.  Patient states that she able to put on her left shoe on without pain in her toe. Patient is ambulatory.  Pt has been buddy taping toe daily since last visit.  Denies swelling of toe.     Review of Systems  Constitutional: Negative for fever and chills.  Musculoskeletal: Negative for arthralgias and gait problem.  Neurological: Negative for weakness and numbness.    Past Medical History  Diagnosis Date  . Screening for other eye conditions   . Other genital herpes   . Body mass index 35.0-35.9, adult   . Type II or unspecified type diabetes mellitus without mention of complication, not stated as uncontrolled   . Reflux esophagitis   . Allergic rhinitis, cause unspecified   . Obesity, unspecified   . Pure hypercholesterolemia   . Essential hypertension, benign   . Anemia   . Allergy    Past Surgical History  Procedure Laterality Date  . Abdominal hysterectomy      2001  . Cholecystectomy      1986  . Gastric bypass      1986  . Cesarean section      [id number]  . Tubal ligation      Allergies  Allergen Reactions  . Codeine Other (See Comments)    Hallucinations    Current Outpatient Prescriptions  Medication Sig Dispense Refill  . amLODipine (NORVASC) 10 MG tablet Take 10 mg by mouth daily.      Marland Kitchen atorvastatin (LIPITOR) 80 MG tablet Take 80 mg by mouth at bedtime.       . cyclobenzaprine (FLEXERIL) 10 MG tablet Take 10 mg by mouth at bedtime as needed for muscle spasms.       . fluticasone (FLONASE) 50 MCG/ACT nasal spray Place 1 spray into both nostrils daily.       Marland Kitchen lisinopril (PRINIVIL,ZESTRIL) 10 MG tablet Take 10 mg by mouth daily.      . meloxicam (MOBIC) 15 MG tablet Take 15 mg by mouth at bedtime.       . metFORMIN (GLUCOPHAGE) 1000 MG tablet Take 1,000 mg by mouth 2 (two) times daily with a meal.      . Multiple Vitamin (MULTIVITAMIN) capsule Take 1 capsule by mouth daily.      Marland Kitchen omeprazole (PRILOSEC) 20 MG capsule Take 20 mg by mouth at bedtime.      . valACYclovir (VALTREX) 1000 MG tablet Take 1,000 mg by mouth 2 (two) times daily as needed (cold sores).       Marland Kitchen aspirin EC 81 MG tablet  Take 81 mg by mouth daily.      . DiphenhydrAMINE HCl (ALLERGY MEDICATION PO) Take 1 tablet by mouth daily.       No current facility-administered medications for this visit.       Objective:    BP 120/68  Pulse 87  Temp(Src) 98 F (36.7 C) (Oral)  Resp 16  Ht 4\' 11"  (1.499 m)  Wt 162 lb 4.8 oz (73.619 kg)  BMI 32.76 kg/m2  SpO2 99%  Physical Exam  Nursing note and vitals reviewed. Constitutional: She is oriented to person, place, and time. She appears well-developed and well-nourished. No distress.  HENT:  Head: Normocephalic and atraumatic.  Eyes: EOM are normal.  Neck: Neck supple.  Cardiovascular: Normal rate.   Pulmonary/Chest: Effort normal. No respiratory distress.  Musculoskeletal: Normal range of motion. She exhibits tenderness.  Ecchymosis along dorsal aspect of second right toe. Full ROM of right second toe without pain. No swelling noted.     Neurological: She is alert and oriented to person, place, and time.  Skin: Skin is warm and dry. She is not diaphoretic.  Psychiatric: She has a normal mood and affect. Her behavior is normal.       Assessment & Plan:   1. Toe contusion, right, subsequent encounter    1.  R toe contusion: Improving.  Recommend supportive shoe for the next two weeks. Only need to buddy tape toe for the next 1-2 weeks if suffering with pain with weighbearing during the day.  Work note provided to return to work.  No orders of the defined types were placed in this encounter.    No Follow-up on file.  I personally performed the services described in this documentation, which was scribed in my presence. The recorded information has been reviewed and is accurate.  Reginia Forts, M.D.  [facility] [address] [place], [place]  [address] [phone] phone [phone] fax

## 2013-11-18 ENCOUNTER — Telehealth: Payer: Self-pay | Admitting: Family Medicine

## 2013-11-18 NOTE — Telephone Encounter (Signed)
Advised pt letter has been written and faxed.

## 2013-11-18 NOTE — Telephone Encounter (Signed)
Patient was seen yesterday. States that her job needs an updated RTW note stating that patient may return without any work restrictions. Please fax to [phone] when completed. AttnMarland Kitchen Loreli Slot  [phone]

## 2013-11-22 DIAGNOSIS — Z0271 Encounter for disability determination: Secondary | ICD-10-CM

## 2014-03-16 ENCOUNTER — Emergency Department (HOSPITAL_COMMUNITY): Payer: BC Managed Care – PPO

## 2014-03-16 ENCOUNTER — Emergency Department (HOSPITAL_COMMUNITY)
Admission: EM | Admit: 2014-03-16 | Discharge: 2014-03-16 | Disposition: A | Discharge status: Home / Self Care | Payer: BC Managed Care – PPO | Attending: Emergency Medicine | Admitting: Emergency Medicine

## 2014-03-16 ENCOUNTER — Encounter (HOSPITAL_COMMUNITY): Payer: Self-pay | Admitting: Emergency Medicine

## 2014-03-16 DIAGNOSIS — R109 Unspecified abdominal pain: Secondary | ICD-10-CM

## 2014-03-16 DIAGNOSIS — Z79899 Other long term (current) drug therapy: Secondary | ICD-10-CM | POA: Insufficient documentation

## 2014-03-16 DIAGNOSIS — K219 Gastro-esophageal reflux disease without esophagitis: Secondary | ICD-10-CM | POA: Diagnosis not present

## 2014-03-16 DIAGNOSIS — E780 Pure hypercholesterolemia: Secondary | ICD-10-CM | POA: Diagnosis not present

## 2014-03-16 DIAGNOSIS — M545 Low back pain: Secondary | ICD-10-CM | POA: Insufficient documentation

## 2014-03-16 DIAGNOSIS — R0602 Shortness of breath: Secondary | ICD-10-CM | POA: Diagnosis not present

## 2014-03-16 DIAGNOSIS — I10 Essential (primary) hypertension: Secondary | ICD-10-CM | POA: Insufficient documentation

## 2014-03-16 DIAGNOSIS — E669 Obesity, unspecified: Secondary | ICD-10-CM | POA: Diagnosis not present

## 2014-03-16 DIAGNOSIS — E119 Type 2 diabetes mellitus without complications: Secondary | ICD-10-CM | POA: Diagnosis not present

## 2014-03-16 DIAGNOSIS — M5489 Other dorsalgia: Secondary | ICD-10-CM

## 2014-03-16 DIAGNOSIS — Z7951 Long term (current) use of inhaled steroids: Secondary | ICD-10-CM | POA: Diagnosis not present

## 2014-03-16 DIAGNOSIS — R1084 Generalized abdominal pain: Secondary | ICD-10-CM

## 2014-03-16 DIAGNOSIS — Z791 Long term (current) use of non-steroidal anti-inflammatories (NSAID): Secondary | ICD-10-CM | POA: Diagnosis not present

## 2014-03-16 DIAGNOSIS — Z7982 Long term (current) use of aspirin: Secondary | ICD-10-CM | POA: Diagnosis not present

## 2014-03-16 DIAGNOSIS — D649 Anemia, unspecified: Secondary | ICD-10-CM | POA: Diagnosis not present

## 2014-03-16 LAB — COMPREHENSIVE METABOLIC PANEL
ALK PHOS: 95 U/L (ref 39–117)
ALT: 20 U/L (ref 0–35)
ANION GAP: 18 — AB (ref 5–15)
AST: 25 U/L (ref 0–37)
Albumin: 4 g/dL (ref 3.5–5.2)
BILIRUBIN TOTAL: 0.3 mg/dL (ref 0.3–1.2)
BUN: 16 mg/dL (ref 6–23)
CO2: 20 meq/L (ref 19–32)
Calcium: 10.1 mg/dL (ref 8.4–10.5)
Chloride: 99 mEq/L (ref 96–112)
Creatinine, Ser: 0.67 mg/dL (ref 0.50–1.10)
GFR calc non Af Amer: >90 mL/min (ref >90)
GLUCOSE: 77 mg/dL (ref 70–99)
POTASSIUM: 4.2 meq/L (ref 3.7–5.3)
Sodium: 137 mEq/L (ref 137–147)
Total Protein: 7.1 g/dL (ref 6.0–8.3)

## 2014-03-16 LAB — CBC WITH DIFFERENTIAL/PLATELET
Basophils Absolute: 0 10*3/uL (ref 0.0–0.1)
Basophils Relative: 0 % (ref 0–1)
Eosinophils Absolute: 0.1 10*3/uL (ref 0.0–0.7)
Eosinophils Relative: 1 % (ref 0–5)
HCT: 34.8 % — ABNORMAL LOW (ref 36.0–46.0)
HEMOGLOBIN: 10.9 g/dL — AB (ref 12.0–15.0)
LYMPHS ABS: 1.9 10*3/uL (ref 0.7–4.0)
Lymphocytes Relative: 16 % (ref 12–46)
MCH: 24.2 pg — ABNORMAL LOW (ref 26.0–34.0)
MCHC: 31.3 g/dL (ref 30.0–36.0)
MCV: 77.3 fL — ABNORMAL LOW (ref 78.0–100.0)
Monocytes Absolute: 1 10*3/uL (ref 0.1–1.0)
Monocytes Relative: 8 % (ref 3–12)
NEUTROS PCT: 75 % (ref 43–77)
Neutro Abs: 8.8 10*3/uL — ABNORMAL HIGH (ref 1.7–7.7)
Platelets: 299 10*3/uL (ref 150–400)
RBC: 4.5 MIL/uL (ref 3.87–5.11)
RDW: 14.5 % (ref 11.5–15.5)
WBC: 11.7 10*3/uL — ABNORMAL HIGH (ref 4.0–10.5)

## 2014-03-16 LAB — URINALYSIS, ROUTINE W REFLEX MICROSCOPIC
Bilirubin Urine: NEGATIVE
GLUCOSE, UA: NEGATIVE mg/dL
Hgb urine dipstick: NEGATIVE
Ketones, ur: NEGATIVE mg/dL
Nitrite: NEGATIVE
PH: 6.5 (ref 5.0–8.0)
Protein, ur: NEGATIVE mg/dL
Specific Gravity, Urine: 1.005 (ref 1.005–1.030)
Urobilinogen, UA: 0.2 mg/dL (ref 0.0–1.0)

## 2014-03-16 LAB — I-STAT TROPONIN, ED: Troponin i, poc: 0 ng/mL (ref 0.00–0.08)

## 2014-03-16 LAB — URINE MICROSCOPIC-ADD ON

## 2014-03-16 LAB — LIPASE, BLOOD: Lipase: 38 U/L (ref 11–59)

## 2014-03-16 MED ORDER — SODIUM CHLORIDE 0.9 % IV BOLUS (SEPSIS)
1000.0000 mL | Freq: Once | INTRAVENOUS | Status: AC
  Administered 2014-03-16: 1000 mL via INTRAVENOUS

## 2014-03-16 MED ORDER — IOHEXOL 300 MG/ML  SOLN
25.0000 mL | Freq: Once | INTRAMUSCULAR | Status: AC | PRN
  Administered 2014-03-16: 25 mL via ORAL

## 2014-03-16 MED ORDER — IOHEXOL 300 MG/ML  SOLN
100.0000 mL | Freq: Once | INTRAMUSCULAR | Status: AC | PRN
  Administered 2014-03-16: 100 mL via INTRAVENOUS

## 2014-03-16 NOTE — ED Notes (Signed)
Pt finished drinking contrast, CT notified 

## 2014-03-16 NOTE — ED Notes (Signed)
Patient transported to CT 

## 2014-03-16 NOTE — Discharge Instructions (Signed)
Back Exercises °Back exercises help treat and prevent back injuries. The goal of back exercises is to increase the strength of your abdominal and back muscles and the flexibility of your back. These exercises should be started when you no longer have back pain. Back exercises include: °· Pelvic Tilt. Lie on your back with your knees bent. Tilt your pelvis until the lower part of your back is against the floor. Hold this position 5 to 10 sec and repeat 5 to 10 times. °· Knee to Chest. Pull first 1 knee up against your chest and hold for 20 to 30 seconds, repeat this with the other knee, and then both knees. This may be done with the other leg straight or bent, whichever feels better. °· Sit-Ups or Curl-Ups. Bend your knees 90 degrees. Start with tilting your pelvis, and do a partial, slow sit-up, lifting your trunk only 30 to 45 degrees off the floor. Take at least 2 to 3 seconds for each sit-up. Do not do sit-ups with your knees out straight. If partial sit-ups are difficult, simply do the above but with only tightening your abdominal muscles and holding it as directed. °· Hip-Lift. Lie on your back with your knees flexed 90 degrees. Push down with your feet and shoulders as you raise your hips a couple inches off the floor; hold for 10 seconds, repeat 5 to 10 times. °· Back arches. Lie on your stomach, propping yourself up on bent elbows. Slowly press on your hands, causing an arch in your low back. Repeat 3 to 5 times. Any initial stiffness and discomfort should lessen with repetition over time. °· Shoulder-Lifts. Lie face down with arms beside your body. Keep hips and torso pressed to floor as you slowly lift your head and shoulders off the floor. °Do not overdo your exercises, especially in the beginning. Exercises may cause you some mild back discomfort which lasts for a few minutes; however, if the pain is more severe, or lasts for more than 15 minutes, do not continue exercises until you see your caregiver.  Improvement with exercise therapy for back problems is slow.  °See your caregivers for assistance with developing a proper back exercise program. °Document Released: 04/24/2004 Document Revised: 06/09/2011 Document Reviewed: 01/16/2011 °ExitCare® Patient Information ©2015 [employer]. This information is not intended to replace advice given to you by your health care provider. Make sure you discuss any questions you have with your health care provider. ° °Abdominal Pain °Many things can cause belly (abdominal) pain. Most times, the belly pain is not dangerous. Many cases of belly pain can be watched and treated at home. °HOME CARE  °· Do not take medicines that help you go poop (laxatives) unless told to by your doctor. °· Only take medicine as told by your doctor. °· Eat or drink as told by your doctor. Your doctor will tell you if you should be on a special diet. °GET HELP IF: °· You do not know what is causing your belly pain. °· You have belly pain while you are sick to your stomach (nauseous) or have runny poop (diarrhea). °· You have pain while you pee or poop. °· Your belly pain wakes you up at night. °· You have belly pain that gets worse or better when you eat. °· You have belly pain that gets worse when you eat fatty foods. °· You have a fever. °GET HELP RIGHT AWAY IF:  °· The pain does not go away within 2 hours. °· You   keep throwing up (vomiting). °· The pain changes and is only in the right or left part of the belly. °· You have bloody or tarry looking poop. °MAKE SURE YOU:  °· Understand these instructions. °· Will watch your condition. °· Will get help right away if you are not doing well or get worse. °Document Released: 09/03/2007 Document Revised: 03/22/2013 Document Reviewed: 11/24/2012 °ExitCare® Patient Information ©2015 [employer]. This information is not intended to replace advice given to you by your health care provider. Make sure you discuss any questions you have with your health care  provider. ° °

## 2014-03-16 NOTE — ED Provider Notes (Signed)
CSN: [id number]     Arrival date & time 03/16/14  1502 History   First MD Initiated Contact with Patient 03/16/14 1733     Chief Complaint  Patient presents with  . Back Pain  . Abdominal Pain     (Consider location/radiation/quality/duration/timing/severity/associated sxs/prior Treatment) Patient is a 63 y.o. female presenting with abdominal pain and back pain. The history is provided by the patient and the spouse.  Abdominal Pain Pain location:  Generalized Pain quality: aching   Pain radiates to:  Does not radiate Pain severity:  Moderate Onset quality:  Gradual Duration:  6 hours Timing:  Constant Progression:  Resolved Chronicity:  New Context: not eating, not retching and not trauma   Relieved by:  Lying down Worsened by:  Palpation Associated symptoms: shortness of breath   Associated symptoms: no chest pain, no chills, no constipation (had normal BM today), no cough, no diarrhea, no dysuria, no fatigue, no fever, no nausea, no sore throat, no vaginal bleeding, no vaginal discharge and no vomiting   Risk factors: obesity   Back Pain Location:  Generalized Quality:  Aching Radiates to:  Does not radiate Pain severity:  Moderate Pain is:  Worse during the day Onset quality:  Sudden Duration:  6 hours Timing:  Constant Progression:  Resolved Chronicity:  New Context comment:  Laying down and applying ICE to back Worsened by:  Standing Associated symptoms: abdominal pain   Associated symptoms: no chest pain, no dysuria, no fever, no headaches and no weakness     Past Medical History  Diagnosis Date  . Screening for other eye conditions   . Other genital herpes   . Body mass index 35.0-35.9, adult   . Type II or unspecified type diabetes mellitus without mention of complication, not stated as uncontrolled   . Reflux esophagitis   . Allergic rhinitis, cause unspecified   . Obesity, unspecified   . Pure hypercholesterolemia   . Essential hypertension, benign    . Anemia   . Allergy    Past Surgical History  Procedure Laterality Date  . Abdominal hysterectomy      2001  . Cholecystectomy      1986  . Gastric bypass      1986  . Cesarean section      [id number]  . Tubal ligation     Family History  Problem Relation Age of Onset  . Diabetes Mother   . COPD Mother   . CVA Father   . Emphysema Father    History  Substance Use Topics  . Smoking status: Never Smoker   . Smokeless tobacco: Never Used  . Alcohol Use: Yes     Comment: rare   OB History    No data available     Review of Systems  Constitutional: Negative for fever, chills, diaphoresis, activity change, appetite change and fatigue.  HENT: Negative for facial swelling, rhinorrhea, sore throat, trouble swallowing and voice change.   Eyes: Negative for photophobia, pain and visual disturbance.  Respiratory: Positive for shortness of breath. Negative for cough, wheezing and stridor.   Cardiovascular: Negative for chest pain, palpitations and leg swelling.  Gastrointestinal: Positive for abdominal pain. Negative for nausea, vomiting, diarrhea, constipation (had normal BM today) and anal bleeding.  Endocrine: Negative.   Genitourinary: Negative for dysuria, vaginal bleeding, vaginal discharge and vaginal pain.  Musculoskeletal: Positive for back pain. Negative for myalgias, arthralgias and gait problem.  Skin: Negative.  Negative for rash.  Allergic/Immunologic: Negative.   Neurological:  Negative for dizziness, tremors, syncope, weakness and headaches.  Psychiatric/Behavioral: Negative for suicidal ideas, sleep disturbance and self-injury.  All other systems reviewed and are negative.     Allergies  Codeine  Home Medications   Prior to Admission medications   Medication Sig Start Date End Date Taking? Authorizing Provider  amLODipine (NORVASC) 10 MG tablet Take 10 mg by mouth daily.   Yes Historical Provider, MD  aspirin EC 81 MG tablet Take 81 mg by mouth daily.    Yes Historical Provider, MD  atorvastatin (LIPITOR) 80 MG tablet Take 80 mg by mouth at bedtime.    Yes Historical Provider, MD  Cholecalciferol (VITAMIN D) 2000 UNITS CAPS Take 2,000 Units by mouth every morning.   Yes Historical Provider, MD  cyclobenzaprine (FLEXERIL) 10 MG tablet Take 10 mg by mouth at bedtime as needed for muscle spasms.  07/04/13  Yes Historical Provider, MD  Ferrous Sulfate Dried (SLOW RELEASE IRON) 45 MG TBCR Take 45 mg by mouth daily.   Yes Historical Provider, MD  fluticasone (FLONASE) 50 MCG/ACT nasal spray Place 1 spray into both nostrils daily.    Yes Historical Provider, MD  lisinopril (PRINIVIL,ZESTRIL) 10 MG tablet Take 10 mg by mouth daily.   Yes Historical Provider, MD  loratadine (CLARITIN) 10 MG tablet Take 10 mg by mouth daily.   Yes Historical Provider, MD  meloxicam (MOBIC) 15 MG tablet Take 15 mg by mouth at bedtime.  07/08/13  Yes Historical Provider, MD  metFORMIN (GLUCOPHAGE) 1000 MG tablet Take 1,000 mg by mouth 2 (two) times daily with a meal.   Yes Historical Provider, MD  Multiple Vitamin (MULTIVITAMIN) capsule Take 1 capsule by mouth daily.   Yes Historical Provider, MD  omeprazole (PRILOSEC) 20 MG capsule Take 20 mg by mouth at bedtime.   Yes Historical Provider, MD  valACYclovir (VALTREX) 1000 MG tablet Take 1,000 mg by mouth 2 (two) times daily as needed (cold sores).    Yes Historical Provider, MD   BP 120/56 mmHg  Pulse 83  Temp(Src) 98 F (36.7 C) (Oral)  Resp 18  Ht 5' (1.524 m)  Wt 161 lb (73.029 kg)  BMI 31.44 kg/m2  SpO2 97% Physical Exam  Constitutional: She is oriented to person, place, and time. She appears well-developed and well-nourished. No distress.  HENT:  Head: Normocephalic and atraumatic.  Right Ear: External ear normal.  Left Ear: External ear normal.  Mouth/Throat: Oropharynx is clear and moist. No oropharyngeal exudate.  Eyes: Conjunctivae and EOM are normal. Pupils are equal, round, and reactive to light. No scleral  icterus.  Neck: Normal range of motion. Neck supple. No JVD present. No tracheal deviation present. No thyromegaly present.  Cardiovascular: Normal rate, regular rhythm and intact distal pulses.  Exam reveals no gallop and no friction rub.   No murmur heard. Pulmonary/Chest: Effort normal and breath sounds normal. No respiratory distress. She has no wheezes. She has no rales.  Abdominal: Soft. Bowel sounds are normal. She exhibits mass (pulsitle mass in abdomen felt on deep palpation.). She exhibits no distension. There is tenderness (mild periumbilical tenderness). There is no rebound and no guarding.  Musculoskeletal: Normal range of motion. She exhibits no edema or tenderness (No back tenderness to palpation).  Neurological: She is alert and oriented to person, place, and time. No cranial nerve deficit. She exhibits normal muscle tone. Coordination normal.  5/5 strength in all 4 extremities. Normal Gait.   Skin: Skin is warm and dry. She is not diaphoretic. No  pallor.  Psychiatric: She has a normal mood and affect. She expresses no homicidal and no suicidal ideation. She expresses no suicidal plans and no homicidal plans.  Nursing note and vitals reviewed.   ED Course  Procedures (including critical care time) Labs Review Labs Reviewed  CBC WITH DIFFERENTIAL - Abnormal; Notable for the following:    WBC 11.7 (*)    Hemoglobin 10.9 (*)    HCT 34.8 (*)    MCV 77.3 (*)    MCH 24.2 (*)    Neutro Abs 8.8 (*)    All other components within normal limits  COMPREHENSIVE METABOLIC PANEL - Abnormal; Notable for the following:    Anion gap 18 (*)    All other components within normal limits  URINALYSIS, ROUTINE W REFLEX MICROSCOPIC - Abnormal; Notable for the following:    Leukocytes, UA TRACE (*)    All other components within normal limits  LIPASE, BLOOD  URINE MICROSCOPIC-ADD ON  I-STAT TROPOININ, ED    Imaging Review Ct Abdomen Pelvis W Contrast  03/16/2014   CLINICAL DATA:   Diffuse abdominal pain. Onset of symptoms this morning. Initial encounter.  EXAM: CT ABDOMEN AND PELVIS WITH CONTRAST  TECHNIQUE: Multidetector CT imaging of the abdomen and pelvis was performed using the standard protocol following bolus administration of intravenous contrast.  CONTRAST:  143mL OMNIPAQUE IOHEXOL 300 MG/ML  SOLN  COMPARISON:  07/11/2013.  FINDINGS: Musculoskeletal: No aggressive osseous lesions. Lower thoracic spondylosis.  Lung Bases: Mild respiratory motion artifact. Lingular scarring or atelectasis. Unchanged small basilar pulmonary nodules. No follow-up required. Coronary artery atherosclerosis is present. If office based assessment of coronary risk factors has not been performed, it is now recommended. Calcification around the mitral annulus.  Liver:   Old granulomatous disease.  Spleen:  Normal.  Gallbladder:  Cholecystectomy.  Common bile duct:  Normal status postcholecystectomy.  Pancreas:  Normal.  Adrenal glands:  Normal bilaterally.  Kidneys: There is a faint area of decreased enhancement on the delayed images in the RIGHT upper renal pole. This could represent a small focus of pyelonephritis in the appropriate clinical setting and correlation with urinalysis is recommended. LEFT kidney appears normal. Both ureters appear normal. No renal calculi.  Stomach: Gastric bypass. No complicating features are identified. No obstruction to suggest an internal hernia.  Small bowel:  Normal.  Colon:   Colonic diverticulosis.  Pelvic Genitourinary:  Hysterectomy.  Urinary bladder normal.  Peritoneum: No free fluid or free air.  Vasculature: Atherosclerosis.  Body Wall: Scarring and muscular atrophy. There is a small fat containing ventral hernia that appears to contained omentum when viewed on the sagittal images.  IMPRESSION: 1. Uncomplicated gastric bypass. 2. Faint area of decreased enhancement in the RIGHT upper renal pole could represent a small focus of pyelonephritis. Correlation with  urinalysis recommended. 3. Cholecystectomy. 4. Hysterectomy. 5. Omentum containing ventral hernia without complicating features. 6. Unchanged tiny benign pulmonary nodules.   Electronically Signed   By: Dereck Ligas M.D.   On: 03/16/2014 20:28     EKG Interpretation None      MDM   Final diagnoses:  Abdominal pain  Other back pain    The patient is a 63 y.o. F with hx of ventral hernia who presents with generalized abdominal pain and back pain "all up and down my spine" that started at 8 am today while at work but completely resolved upon going home, laying down, and applying ice. Patient AFVSS. Denies any pain or other symptoms while in the  ED. Labs unremarkable. CT abd/pevis redemonstrates known Vental hernia which is not incarcerated and shows no other acute findings. Patient is reexamined and continues to be pain free while in ED.   I estimate there is LOW risk for ACUTE APPENDICITIS, BOWEL OBSTRUCTION, CHOLECYSTITIS, DIVERTICULITIS, INCARCERATED HERNIA, MESENTERIC ISCHEMIA, PANCREATITIS, or PERFORATED BOWEL or ULCER, thus I consider the discharge disposition reasonable. Also, there is no evidence or peritonitis, sepsis or toxicity. We have discussed the diagnosis and risks and agree with discharging home to follow-up with their primary doctor. We also discussed returning to the Emergency Department immediately if new or worsening symptoms occur and have discussed the symptoms which are most concerning (e.g., bloody stool, fever, changing or worsening pain, vomiting) that necessitate immediate return.  Patient seen with attending, Dr. Jeanell Sparrow, who oversaw clinical decision making.     Margaretann Loveless, MD 03/16/14 [id number]  Shaune Pollack, MD 03/18/14 [id number]

## 2014-03-16 NOTE — ED Notes (Signed)
C/o back and abd pain onset this AM, no V/D, no CP/SOB, A/O X4, ambulatory and in NAD

## 2014-03-17 LAB — CBG MONITORING, ED: GLUCOSE-CAPILLARY: 106 mg/dL — AB (ref 70–99)

## 2014-12-05 ENCOUNTER — Ambulatory Visit: Payer: Self-pay | Admitting: Surgery

## 2014-12-05 NOTE — H&P (Signed)
History of Present Illness Ashley Collier. Ashley Gatti Ashley Collier; 12/05/2014 9:21 PM) Patient words: hernia.  The patient is a 64 year old female who presents with an incisional hernia. This is a 64 year old female referred by Dr. Bernerd Limbo who was evaluated last year for severe periumbilical pain and swelling. The patient has had a small ventral hernia around her umbilicus for the last two years. She was at work one morning when she noticed severe pain and swelling this area. They were able to reduce this in the emergency department. A CT scan showed that there was no bowel in the hernia. She is now referred for surgical evaluation. The patient works at [employer] at her job does require some lifting. She has been relatively asymptomatic, but has been refraining from any heavy lifting. She postponed her surgery because of financial reasons.  She has a history of open gastric bypass surgery/ cholecystectomy in [place]: Diffuse abdominal pain. Onset of symptoms this morning. Initial encounter.  EXAM: CT ABDOMEN AND PELVIS WITH CONTRAST  TECHNIQUE: Multidetector CT imaging of the abdomen and pelvis was performed using the standard protocol following bolus administration of intravenous contrast.  CONTRAST: 165mL OMNIPAQUE IOHEXOL 300 MG/ML SOLN  COMPARISON: 07/11/2013.  FINDINGS: Musculoskeletal: No aggressive osseous lesions. Lower thoracic spondylosis.  Lung Bases: Mild respiratory motion artifact. Lingular scarring or atelectasis. Unchanged small basilar pulmonary nodules. No follow-up required. Coronary artery atherosclerosis is present. If office based assessment of coronary risk factors has not been performed, it is now recommended. Calcification around the mitral annulus.  Liver: Old granulomatous disease.  Spleen: Normal.  Gallbladder: Cholecystectomy.  Common bile duct: Normal status postcholecystectomy.  Pancreas: Normal.  Adrenal glands: Normal  bilaterally.  Kidneys: There is a faint area of decreased enhancement on the delayed images in the RIGHT upper renal pole. This could represent a small focus of pyelonephritis in the appropriate clinical setting and correlation with urinalysis is recommended. LEFT kidney appears normal. Both ureters appear normal. No renal calculi.  Stomach: Gastric bypass. No complicating features are identified. No obstruction to suggest an internal hernia.  Small bowel: Normal.  Colon: Colonic diverticulosis.  Pelvic Genitourinary: Hysterectomy. Urinary bladder normal.  Peritoneum: No free fluid or free air.  Vasculature: Atherosclerosis.  Body Wall: Scarring and muscular atrophy. There is a small fat containing ventral hernia that appears to contained omentum when viewed on the sagittal images.  IMPRESSION: 1. Uncomplicated gastric bypass. 2. Faint area of decreased enhancement in the RIGHT upper renal pole could represent a small focus of pyelonephritis. Correlation with urinalysis recommended. 3. Cholecystectomy. 4. Hysterectomy. 5. Omentum containing ventral hernia without complicating features. 6. Unchanged tiny benign pulmonary nodules.   Electronically Signed By: Ashley Collier M.D. On: 03/16/2014 20:28  Other Problems Ashley Collier, Ashley Collier; 12/05/2014 3:11 PM) Bladder Problems Depression Gastroesophageal Reflux Disease Hemorrhoids High blood pressure Hypercholesterolemia  Past Surgical History Ashley Collier, Ashley Collier; 12/05/2014 3:11 PM) Cesarean Section - Multiple Gallbladder Surgery - Open Gastric Bypass Hysterectomy (not due to cancer) - Complete Resection of Stomach  Diagnostic Studies History Ashley Collier, Ashley Collier; 12/05/2014 3:11 PM) Colonoscopy 1-5 years ago Mammogram >3 years ago Pap Smear >5 years ago  Allergies Ashley Collier, Ashley Collier; 12/05/2014 3:12 PM) Codeine Phosphate *ANALGESICS - OPIOID*  Medication History (Ashley Collier, Ashley Collier; 12/05/2014 3:13  PM) AmLODIPine Besylate (10MG  Tablet, Oral) Active. Cyclobenzaprine HCl (10MG  Tablet, Oral) Active. Fluticasone Propionate (50MCG/ACT Suspension, Nasal) Active. Lisinopril (10MG  Tablet, Oral) Active. Meloxicam (15MG  Tablet, Oral) Active. MetFORMIN HCl (1000MG  Tablet,  Oral) Active. Atorvastatin Calcium (80MG  Tablet, Oral) Active. TraMADol HCl (50MG  Tablet, Oral as needed) Active. Medications Reconciled  Social History Ashley Collier, Ashley Collier; 12/05/2014 3:11 PM) Alcohol use Occasional alcohol use. Caffeine use Carbonated beverages. No drug use Tobacco use Never smoker.  Family History Ashley Collier, [place]; 12/05/2014 3:11 PM) Cerebrovascular Accident Father. Diabetes Mellitus Father, Mother. Heart Disease Mother. Hypertension Father, Mother. Melanoma Father. Respiratory Condition Father, Mother.  Pregnancy / Birth History Ashley Collier, [place]; 12/05/2014 3:11 PM) Age at menarche 20 years. Age of menopause 82-55 Gravida 2 Irregular periods     Review of Systems (Ashley Collier; 12/05/2014 3:11 PM) General Not Present- Appetite Loss, Chills, Fatigue, Fever, Night Sweats, Weight Gain and Weight Loss. Skin Not Present- Change in Wart/Mole, Dryness, Hives, Jaundice, New Lesions, Non-Healing Wounds, Rash and Ulcer. HEENT Present- Hearing Loss, Seasonal Allergies and Wears glasses/contact lenses. Not Present- Earache, Hoarseness, Nose Bleed, Oral Ulcers, Ringing in the Ears, Sinus Pain, Sore Throat, Visual Disturbances and Yellow Eyes. Respiratory Not Present- Bloody sputum, Chronic Cough, Difficulty Breathing, Snoring and Wheezing. Breast Not Present- Breast Mass, Breast Pain, Nipple Discharge and Skin Changes. Cardiovascular Not Present- Chest Pain, Difficulty Breathing Lying Down, Leg Cramps, Palpitations, Rapid Heart Rate, Shortness of Breath and Swelling of Extremities. Gastrointestinal Present- Abdominal Pain, Hemorrhoids and Indigestion. Not Present- Bloating, Bloody  Stool, Change in Bowel Habits, Chronic diarrhea, Constipation, Difficulty Swallowing, Excessive gas, Gets full quickly at meals, Nausea, Rectal Pain and Vomiting. Female Genitourinary Present- Frequency, Nocturia and Urgency. Not Present- Painful Urination and Pelvic Pain. Musculoskeletal Present- Back Pain, Joint Pain, Joint Stiffness and Muscle Pain. Not Present- Muscle Weakness and Swelling of Extremities. Neurological Present- Decreased Memory and Tingling. Not Present- Fainting, Headaches, Numbness, Seizures, Tremor, Trouble walking and Weakness. Psychiatric Not Present- Anxiety, Bipolar, Change in Sleep Pattern, Depression, Fearful and Frequent crying. Hematology Present- Easy Bruising. Not Present- Excessive bleeding, Gland problems, HIV and Persistent Infections.  Vitals (Ashley Collier Ashley Collier; 12/05/2014 3:12 PM) 12/05/2014 3:12 PM Weight: 164 lb Height: 60in Body Surface Area: 1.77 m Body Mass Index: 32.03 kg/m Temp.: 97.62F(Temporal)  Pulse: 76 (Regular)  BP: 128/82 (Sitting, Left Arm, Standard)     Physical Exam Ashley Key K. Vickey Boak Ashley Collier; 12/05/2014 9:21 PM)  The physical exam findings are as follows: Note:WDWN in NAD HEENT: EOMI, sclera anicteric Neck: No masses, no thyromegaly Lungs: CTA bilaterally; normal respiratory effort CV: Regular rate and rhythm; no murmurs Abd: +bowel sounds, soft, long midline incision Palpable mass to the right and just below the umbilicus - partially reducible when supine Ext: Well-perfused; no edema Skin: Warm, dry; no sign of jaundice    Assessment & Plan Ashley Key K. 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

## 2015-11-19 ENCOUNTER — Other Ambulatory Visit: Payer: Self-pay | Admitting: Surgery

## 2015-11-19 DIAGNOSIS — R1030 Lower abdominal pain, unspecified: Secondary | ICD-10-CM

## 2015-11-27 ENCOUNTER — Ambulatory Visit
Admission: RE | Admit: 2015-11-27 | Discharge: 2015-11-27 | Disposition: A | Discharge status: Home / Self Care | Payer: BC Managed Care – PPO | Source: Ambulatory Visit | Attending: Surgery | Admitting: Surgery

## 2015-11-27 DIAGNOSIS — R1030 Lower abdominal pain, unspecified: Secondary | ICD-10-CM

## 2015-11-27 MED ORDER — IOPAMIDOL (ISOVUE-300) INJECTION 61%
100.0000 mL | Freq: Once | INTRAVENOUS | Status: AC | PRN
  Administered 2015-11-27: 100 mL via INTRAVENOUS

## 2016-01-14 ENCOUNTER — Ambulatory Visit: Payer: Self-pay | Admitting: Surgery

## 2016-01-14 NOTE — H&P (Signed)
History of Present Illness Ashley Collier. Ashley Bostwick Collier; 01/14/2016 1:28 PM) Patient words: follow up.  The patient is a 65 year old female who presents with an abdominal wall hernia. This is a 65 yo female who has been previously evaluated for incisional hernia. She was referred by Dr. Bernerd Collier who was evaluated last year for severe periumbilical pain and swelling. The patient has had a small ventral hernia around her umbilicus for the last two years. She was at work one morning when she noticed severe pain and swelling this area. They were able to reduce this in the emergency department. A CT scan showed that there was no bowel in the hernia. She is now referred for surgical evaluation. She has been relatively asymptomatic, but has been refraining from any heavy lifting. She postponed her surgery because of financial reasons.  She has a history of open gastric bypass surgery/ cholecystectomy in 1986.  Over the last several months, she has noted some vague mild discomfort in her right suprapubic region. There is not a clear bulge in this area but she does feel some fullness when compared to the left side. She denies any urinary or symptoms. She brought this to the attention of her PCP who has referred her back to [place] for evaluation. The patient is no longer working and has refraining from any heavy lifting. She is not having any significant symptoms from her periumbilical hernia.  I obtained a CT scan that showed no sign of inguinal hernia or other etiology for her right groin pain.  CLINICAL DATA: Bilateral groin pain for several months, history of gastric bypass  EXAM: CT ABDOMEN AND PELVIS WITH CONTRAST  TECHNIQUE: Multidetector CT imaging of the abdomen and pelvis was performed using the standard protocol following bolus administration of intravenous contrast.  CONTRAST: 177mL ISOVUE-300 IOPAMIDOL (ISOVUE-300) INJECTION 61%  COMPARISON: CT abdomen pelvis of 03/16/2014, and CT  abdomen pelvis of 01/06/2012  Creatinine was obtained on site at [name] at [address]Results: Creatinine 0.4 mg/dL. GFR of 111.  FINDINGS: On images through the lung bases, the noncalcified nodules are stable. The largest nodule measures 8 mm just above the hemidiaphragm in the left lower lobe, unchanged in size. No pleural effusion is seen.  The liver enhances with no focal abnormality and no ductal dilatation is seen. Surgical clips are present from prior cholecystectomy. The pancreas is normal in size and the pancreatic duct is not dilated. The adrenal glands and the spleen are stable. Changes of gastric bypass are noted. The kidneys enhance with no calculus or mass and on delayed images, the pelvocaliceal systems are unremarkable. The focus of diminished attenuation within the posterior right kidney noted previously is not seen currently. The proximal ureters are normal in caliber. Moderate abdominal aortic atherosclerosis is present. No aneurysmal dilatation is seen.  The urinary bladder is not well distended but no abnormality is seen. The uterus has previously been resected. No adnexal lesion is seen. No fluid is noted within the pelvis. No abnormality of the colon is seen. The terminal ileum is unremarkable. The appendix is not visualized. No pelvic mass or adenopathy is seen. No groin adenopathy is noted. There is no evidence of inguinal hernia. There is prominence of the soft tissues of the lower pelvis anteriorly which may represent cellulitis. Clinical correlation is recommended. The lumbar vertebrae are normal alignment. Degenerative disc disease is present at L5- S1.  IMPRESSION: 1. No explanation for the patient's groin pain is seen. There is  however possible cellulitis of the superficial soft tissues of the lower pelvis. No adenopathy is seen. 2. Stable noncalcified lung nodules most consistent with a benign process. 3. Degenerative disc  disease at L5-S1.   Electronically Signed By: Ashley Collier M.D. On: 11/27/2015 16:47   The groin pain is slightly improved, but still present.   Allergies (Ashley Eversole, LPN; 075-GRM X33443 AM) Codeine Phosphate *ANALGESICS - OPIOID*  Medication History (Ashley Eversole, LPN; 075-GRM X33443 AM) Vitamin D3 (1000UNIT Capsule, Oral) Active. Ferrous Sulfate (325 (65 Fe)MG Tablet, Oral) Active. Omeprazole (10MG  Capsule DR, Oral) Active. Multiple Vitamin (Oral) Active. Valtrex (1GM Tablet, Oral) Active. Super B Complex Maxi (Oral) Active. Aspirin (81MG  Tablet Chewable, Oral) Active. Loratadine (10MG  (Rapid) Tablet, Oral) Active. AmLODIPine Besylate (10MG  Tablet, Oral) Active. Cyclobenzaprine HCl (10MG  Tablet, Oral) Active. Fluticasone Propionate (50MCG/ACT Suspension, Nasal) Active. Lisinopril (10MG  Tablet, Oral) Active. Meloxicam (15MG  Tablet, Oral) Active. MetFORMIN HCl (1000MG  Tablet, Oral) Active. Atorvastatin Calcium (80MG  Tablet, Oral) Active. TraMADol HCl (50MG  Tablet, Oral as needed) Active. Medications Reconciled    Vitals (Ashley Eversole LPN; 075-GRM [id number] AM) 01/14/2016 9:45 AM Weight: 178 lb Height: 60in Body Surface Area: 1.78 m Body Mass Index: 34.76 kg/m  Temp.: 98.28F(Oral)  Pulse: 101 (Regular)  BP: 122/70 (Sitting, Left Arm, Standard)      Physical Exam Ashley Key K. Otillia Cordone Collier; 01/14/2016 1:28 PM)  The physical exam findings are as follows: Note:WDWN in NAD Eyes: Pupils equal, round; sclera anicteric HENT: Oral mucosa moist; good dentition Neck: No masses palpated, no thyromegaly Lungs: CTA bilaterally; normal respiratory effort CV: Regular rate and rhythm; no murmurs; extremities well-perfused with no edema Abd: +bowel sounds, soft, non-tender, no palpable organomegaly; palpable hernia to the right of the umbilicus - not reducible Skin: Warm, dry; no sign of jaundice Psychiatric - alert and oriented x 4; calm  mood and affect    Assessment & Plan Ashley Collier; 01/14/2016 10:03 AM)  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 The surgical procedure has been discussed with the patient. Potential risks, benefits, alternative treatments, and expected outcomes have been explained. All of the patient's questions at this time have been answered. The likelihood of reaching the patient's treatment goal is good. The patient understand the proposed surgical procedure and wishes to proceed.  Ashley Collier. Georgette Dover, Collier, [facility] Surgery  General/ Trauma Surgery  01/14/2016 1:30 PM

## 2016-02-07 NOTE — Pre-Procedure Instructions (Signed)
Ashley Collier  02/07/2016      [facility], [address] [place] [place] [address] Phone: [phone] Fax: [phone]    Your procedure is scheduled on November 14  Report to [facility] at [place].  Call this number if you have problems the morning of surgery:  [phone]   Remember:  Do not eat food or drink liquids after midnight.   Take these medicines the morning of surgery with A SIP OF WATER amLODipine (NORVASC), cyclobenzaprine (FLEXERIL), fluticasone (FLONASE), loratadine (CLARITIN), omeprazole (PRILOSEC)  7 days prior to surgery STOP taking any Aspirin, Aleve, Naproxen, Ibuprofen, Motrin, Advil, Goody's, BC's, all herbal medications, fish oil, and all vitamins  WHAT DO I DO ABOUT MY DIABETES MEDICATION?  Marland Kitchen Do not take oral diabetes medicines (pills) the morning of surgery. METFORMIN    How to Manage Your Diabetes Before and After Surgery  Why is it important to control my blood sugar before and after surgery? . Improving blood sugar levels before and after surgery helps healing and can limit problems. . A way of improving blood sugar control is eating a healthy diet by: o  Eating less sugar and carbohydrates o  Increasing activity/exercise o  Talking with your doctor about reaching your blood sugar goals . High blood sugars (greater than 180 mg/dL) can raise your risk of infections and slow your recovery, so you will need to focus on controlling your diabetes during the weeks before surgery. . Make sure that the doctor who takes care of your diabetes knows about your planned surgery including the date and location.  How do I manage my blood sugar before surgery? . Check your blood sugar at least 4 times a day, starting 2 days before surgery, to make sure that the level is not too high or low. o Check your blood sugar the morning of your surgery when you wake up and every 2 hours  until you get to the Short Stay unit. . If your blood sugar is less than 70 mg/dL, you will need to treat for low blood sugar: o Do not take insulin. o Treat a low blood sugar (less than 70 mg/dL) with  cup of clear juice (cranberry or apple), 4 glucose tablets, OR glucose gel. o Recheck blood sugar in 15 minutes after treatment (to make sure it is greater than 70 mg/dL). If your blood sugar is not greater than 70 mg/dL on recheck, call [phone] for further instructions. . Report your blood sugar to the short stay nurse when you get to Short Stay.  . If you are admitted to the hospital after surgery: o Your blood sugar will be checked by the staff and you will probably be given insulin after surgery (instead of oral diabetes medicines) to make sure you have good blood sugar levels. o The goal for blood sugar control after surgery is 80-180 mg/dL.     Do not wear jewelry, make-up or nail polish.  Do not wear lotions, powders, or perfumes, or deoderant.  Do not shave 48 hours prior to surgery.    Do not bring valuables to the hospital.  [name] is not responsible for any belongings or valuables.  Contacts, dentures or bridgework may not be worn into surgery.  Leave your suitcase in the car.  After surgery it may be brought to your room.  For patients admitted to the hospital, discharge time will be determined by  your treatment team.  Patients discharged the day of surgery will not be allowed to drive home.    Special instructions:   [facility]- Preparing For Surgery  Before surgery, you can play an important role. Because skin is not sterile, your skin needs to be as free of germs as possible. You can reduce the number of germs on your skin by washing with CHG (chlorahexidine gluconate) Soap before surgery.  CHG is an antiseptic cleaner which kills germs and bonds with the skin to continue killing germs even after washing.  Please do not use if you have an allergy to CHG or  antibacterial soaps. If your skin becomes reddened/irritated stop using the CHG.  Do not shave (including legs and underarms) for at least 48 hours prior to first CHG shower. It is OK to shave your face.  Please follow these instructions carefully.   1. Shower the NIGHT BEFORE SURGERY and the MORNING OF SURGERY with CHG.   2. If you chose to wash your hair, wash your hair first as usual with your normal shampoo.  3. After you shampoo, rinse your hair and body thoroughly to remove the shampoo.  4. Use CHG as you would any other liquid soap. You can apply CHG directly to the skin and wash gently with a scrungie or a clean washcloth.   5. Apply the CHG Soap to your body ONLY FROM THE NECK DOWN.  Do not use on open wounds or open sores. Avoid contact with your eyes, ears, mouth and genitals (private parts). Wash genitals (private parts) with your normal soap.  6. Wash thoroughly, paying special attention to the area where your surgery will be performed.  7. Thoroughly rinse your body with warm water from the neck down.  8. DO NOT shower/wash with your normal soap after using and rinsing off the CHG Soap.  9. Pat yourself dry with a CLEAN TOWEL.   10. Wear CLEAN PAJAMAS   11. Place CLEAN SHEETS on your bed the night of your first shower and DO NOT SLEEP WITH PETS.    Day of Surgery: Do not apply any deodorants/lotions. Please wear clean clothes to the hospital/surgery center.       Please read over the following fact sheets that you were given.

## 2016-02-08 ENCOUNTER — Encounter (HOSPITAL_COMMUNITY): Payer: Self-pay

## 2016-02-08 ENCOUNTER — Encounter (HOSPITAL_COMMUNITY)
Admission: RE | Admit: 2016-02-08 | Discharge: 2016-02-08 | Disposition: A | Discharge status: Home / Self Care | Payer: BC Managed Care – PPO | Source: Ambulatory Visit | Attending: Surgery | Admitting: Surgery

## 2016-02-08 ENCOUNTER — Other Ambulatory Visit: Payer: Self-pay

## 2016-02-08 DIAGNOSIS — Z01818 Encounter for other preprocedural examination: Secondary | ICD-10-CM | POA: Insufficient documentation

## 2016-02-08 DIAGNOSIS — K432 Incisional hernia without obstruction or gangrene: Secondary | ICD-10-CM | POA: Insufficient documentation

## 2016-02-08 HISTORY — DX: Unspecified osteoarthritis, unspecified site: M19.90

## 2016-02-08 HISTORY — DX: Family history of other specified conditions: Z84.89

## 2016-02-08 LAB — CBC
HCT: 39.6 % (ref 36.0–46.0)
Hemoglobin: 12.9 g/dL (ref 12.0–15.0)
MCH: 27.6 pg (ref 26.0–34.0)
MCHC: 32.6 g/dL (ref 30.0–36.0)
MCV: 84.6 fL (ref 78.0–100.0)
Platelets: 266 10*3/uL (ref 150–400)
RBC: 4.68 MIL/uL (ref 3.87–5.11)
RDW: 13.6 % (ref 11.5–15.5)
WBC: 8.2 10*3/uL (ref 4.0–10.5)

## 2016-02-08 LAB — BASIC METABOLIC PANEL
Anion gap: 11 (ref 5–15)
BUN: 12 mg/dL (ref 6–20)
CALCIUM: 9.6 mg/dL (ref 8.9–10.3)
CO2: 20 mmol/L — ABNORMAL LOW (ref 22–32)
Chloride: 108 mmol/L (ref 101–111)
Creatinine, Ser: 0.54 mg/dL (ref 0.44–1.00)
GFR calc Af Amer: >60 mL/min (ref >60)
GLUCOSE: 97 mg/dL (ref 65–99)
Potassium: 3.9 mmol/L (ref 3.5–5.1)
Sodium: 139 mmol/L (ref 135–145)

## 2016-02-08 LAB — GLUCOSE, CAPILLARY: GLUCOSE-CAPILLARY: 99 mg/dL (ref 65–99)

## 2016-02-08 NOTE — Progress Notes (Signed)
PCP - Bernerd Limbo Cardiologist - denies  Chest x-ray - not needed EKG - 02/08/16 Stress Test - denies ECHO - denies Cardiac Cath - denies  Nephew had difficulty with anesthesia.  Patient stated that nephew worked with a lot of chemicals and that when he had surgery it ws believed that the anesthesia interacted with the chemicals that he was exposed to and caused him to have difficulty waking up after surgery.  Patient denies any history if difficulty regulating body temperatures   Fasting blood sugar was 115 Usually checks her sugar once a day    Patient denies shortness of breath, fever, cough and chest pain at PAT appointment

## 2016-02-09 LAB — HEMOGLOBIN A1C
HEMOGLOBIN A1C: 6.5 % — AB (ref 4.8–5.6)
Mean Plasma Glucose: 140 mg/dL

## 2016-02-11 MED ORDER — CEFAZOLIN SODIUM-DEXTROSE 2-4 GM/100ML-% IV SOLN
2.0000 g | INTRAVENOUS | Status: AC
  Administered 2016-02-12: 2 g via INTRAVENOUS
  Filled 2016-02-11: qty 100

## 2016-02-11 NOTE — Anesthesia Preprocedure Evaluation (Addendum)
Anesthesia Evaluation  Patient identified by MRN, date of birth, ID band Patient awake    Reviewed: Allergy & Precautions, H&P , NPO status , Patient's Chart, lab work & pertinent test results  Airway Mallampati: III  TM Distance: >3 FB Neck ROM: Full    Dental no notable dental hx. (+) Teeth Intact, Dental Advisory Given   Pulmonary neg pulmonary ROS,    Pulmonary exam normal breath sounds clear to auscultation       Cardiovascular hypertension, Pt. on medications  Rhythm:Regular Rate:Normal     Neuro/Psych negative neurological ROS  negative psych ROS   GI/Hepatic negative GI ROS, Neg liver ROS,   Endo/Other  diabetes, Type 2, Oral Hypoglycemic Agents  Renal/GU negative Renal ROS  negative genitourinary   Musculoskeletal  (+) Arthritis , Osteoarthritis,    Abdominal   Peds  Hematology negative hematology ROS (+) anemia ,   Anesthesia Other Findings   Reproductive/Obstetrics negative OB ROS                            Anesthesia Physical Anesthesia Plan  ASA: II  Anesthesia Plan: General   Post-op Pain Management:    Induction: Intravenous  Airway Management Planned: Oral ETT  Additional Equipment:   Intra-op Plan:   Post-operative Plan: Extubation in OR  Informed Consent: I have reviewed the patients History and Physical, chart, labs and discussed the procedure including the risks, benefits and alternatives for the proposed anesthesia with the patient or authorized representative who has indicated his/her understanding and acceptance.   Dental advisory given  Plan Discussed with: CRNA  Anesthesia Plan Comments:         Anesthesia Quick Evaluation

## 2016-02-12 ENCOUNTER — Encounter (HOSPITAL_COMMUNITY)
Admission: RE | Disposition: A | Discharge status: Home / Self Care | Payer: Self-pay | Source: Ambulatory Visit | Attending: Surgery

## 2016-02-12 ENCOUNTER — Ambulatory Visit (HOSPITAL_COMMUNITY): Payer: BC Managed Care – PPO | Admitting: Anesthesiology

## 2016-02-12 ENCOUNTER — Ambulatory Visit (HOSPITAL_COMMUNITY)
Admission: RE | Admit: 2016-02-12 | Discharge: 2016-02-12 | Disposition: A | Discharge status: Home / Self Care | Payer: BC Managed Care – PPO | Source: Ambulatory Visit | Attending: Surgery | Admitting: Surgery

## 2016-02-12 ENCOUNTER — Encounter (HOSPITAL_COMMUNITY): Payer: Self-pay | Admitting: *Deleted

## 2016-02-12 DIAGNOSIS — Z79899 Other long term (current) drug therapy: Secondary | ICD-10-CM | POA: Insufficient documentation

## 2016-02-12 DIAGNOSIS — Z7982 Long term (current) use of aspirin: Secondary | ICD-10-CM | POA: Diagnosis not present

## 2016-02-12 DIAGNOSIS — Z7951 Long term (current) use of inhaled steroids: Secondary | ICD-10-CM | POA: Insufficient documentation

## 2016-02-12 DIAGNOSIS — K432 Incisional hernia without obstruction or gangrene: Secondary | ICD-10-CM | POA: Insufficient documentation

## 2016-02-12 DIAGNOSIS — Z791 Long term (current) use of non-steroidal anti-inflammatories (NSAID): Secondary | ICD-10-CM | POA: Diagnosis not present

## 2016-02-12 DIAGNOSIS — D649 Anemia, unspecified: Secondary | ICD-10-CM | POA: Insufficient documentation

## 2016-02-12 DIAGNOSIS — Z9884 Bariatric surgery status: Secondary | ICD-10-CM | POA: Insufficient documentation

## 2016-02-12 DIAGNOSIS — E119 Type 2 diabetes mellitus without complications: Secondary | ICD-10-CM | POA: Insufficient documentation

## 2016-02-12 DIAGNOSIS — I10 Essential (primary) hypertension: Secondary | ICD-10-CM | POA: Insufficient documentation

## 2016-02-12 DIAGNOSIS — Z7984 Long term (current) use of oral hypoglycemic drugs: Secondary | ICD-10-CM | POA: Diagnosis not present

## 2016-02-12 HISTORY — PX: VENTRAL HERNIA REPAIR: SHX424

## 2016-02-12 HISTORY — PX: INSERTION OF MESH: SHX5868

## 2016-02-12 LAB — GLUCOSE, CAPILLARY
GLUCOSE-CAPILLARY: 112 mg/dL — AB (ref 65–99)
GLUCOSE-CAPILLARY: 146 mg/dL — AB (ref 65–99)

## 2016-02-12 SURGERY — REPAIR, HERNIA, VENTRAL
Anesthesia: General | Site: Abdomen

## 2016-02-12 MED ORDER — MIDAZOLAM HCL 5 MG/5ML IJ SOLN
INTRAMUSCULAR | Status: DC | PRN
  Administered 2016-02-12: 2 mg via INTRAVENOUS

## 2016-02-12 MED ORDER — HYDROMORPHONE HCL 1 MG/ML IJ SOLN
0.2500 mg | INTRAMUSCULAR | Status: DC | PRN
Start: 2016-02-12 — End: 2016-02-12
  Administered 2016-02-12 (×4): 0.5 mg via INTRAVENOUS

## 2016-02-12 MED ORDER — DEXAMETHASONE SODIUM PHOSPHATE 10 MG/ML IJ SOLN
INTRAMUSCULAR | Status: DC | PRN
  Administered 2016-02-12: 5 mg via INTRAVENOUS

## 2016-02-12 MED ORDER — LIDOCAINE HCL (CARDIAC) 20 MG/ML IV SOLN
INTRAVENOUS | Status: DC | PRN
  Administered 2016-02-12: 60 mg via INTRAVENOUS

## 2016-02-12 MED ORDER — 0.9 % SODIUM CHLORIDE (POUR BTL) OPTIME
TOPICAL | Status: DC | PRN
  Administered 2016-02-12: 1000 mL

## 2016-02-12 MED ORDER — FENTANYL CITRATE (PF) 100 MCG/2ML IJ SOLN
INTRAMUSCULAR | Status: AC
  Filled 2016-02-12: qty 4

## 2016-02-12 MED ORDER — HYDROCODONE-ACETAMINOPHEN 5-325 MG PO TABS: 1.0000 | ORAL_TABLET | Freq: Four times a day (QID) | ORAL | 0 refills | Status: DC | PRN

## 2016-02-12 MED ORDER — SUGAMMADEX SODIUM 200 MG/2ML IV SOLN
INTRAVENOUS | Status: DC | PRN
  Administered 2016-02-12: 160 mg via INTRAVENOUS

## 2016-02-12 MED ORDER — HYDROMORPHONE HCL 2 MG/ML IJ SOLN
INTRAMUSCULAR | Status: AC
  Filled 2016-02-12: qty 1

## 2016-02-12 MED ORDER — BUPIVACAINE HCL (PF) 0.25 % IJ SOLN
INTRAMUSCULAR | Status: DC | PRN
  Administered 2016-02-12: 10 mL

## 2016-02-12 MED ORDER — ROCURONIUM BROMIDE 100 MG/10ML IV SOLN
INTRAVENOUS | Status: DC | PRN
  Administered 2016-02-12: 50 mg via INTRAVENOUS

## 2016-02-12 MED ORDER — PROPOFOL 10 MG/ML IV BOLUS
INTRAVENOUS | Status: AC
  Filled 2016-02-12: qty 20

## 2016-02-12 MED ORDER — BUPIVACAINE-EPINEPHRINE 0.25% -1:200000 IJ SOLN: INTRAMUSCULAR | Status: DC | PRN

## 2016-02-12 MED ORDER — MIDAZOLAM HCL 2 MG/2ML IJ SOLN
INTRAMUSCULAR | Status: AC
  Filled 2016-02-12: qty 2

## 2016-02-12 MED ORDER — CEFAZOLIN SODIUM-DEXTROSE 2-4 GM/100ML-% IV SOLN
INTRAVENOUS | Status: AC
  Filled 2016-02-12: qty 100

## 2016-02-12 MED ORDER — LACTATED RINGERS IV SOLN
INTRAVENOUS | Status: DC | PRN
  Administered 2016-02-12 (×2): via INTRAVENOUS

## 2016-02-12 MED ORDER — BUPIVACAINE HCL (PF) 0.25 % IJ SOLN
INTRAMUSCULAR | Status: AC
  Filled 2016-02-12: qty 30

## 2016-02-12 MED ORDER — PROPOFOL 10 MG/ML IV BOLUS
INTRAVENOUS | Status: DC | PRN
  Administered 2016-02-12: 110 mg via INTRAVENOUS

## 2016-02-12 MED ORDER — ONDANSETRON HCL 4 MG/2ML IJ SOLN
INTRAMUSCULAR | Status: DC | PRN
  Administered 2016-02-12: 4 mg via INTRAVENOUS

## 2016-02-12 MED ORDER — FENTANYL CITRATE (PF) 100 MCG/2ML IJ SOLN
INTRAMUSCULAR | Status: DC | PRN
  Administered 2016-02-12 (×2): 50 ug via INTRAVENOUS
  Administered 2016-02-12: 100 ug via INTRAVENOUS

## 2016-02-12 SURGICAL SUPPLY — 42 items
APL SKNCLS STERI-STRIP NONHPOA (GAUZE/BANDAGES/DRESSINGS) ×1
BENZOIN TINCTURE PRP APPL 2/3 (GAUZE/BANDAGES/DRESSINGS) ×3 IMPLANT
BLADE SURG ROTATE 9660 (MISCELLANEOUS) IMPLANT
CANISTER SUCTION 2500CC (MISCELLANEOUS) ×3 IMPLANT
CHLORAPREP W/TINT 26ML (MISCELLANEOUS) ×3 IMPLANT
CLOSURE WOUND 1/2 X4 (GAUZE/BANDAGES/DRESSINGS) ×1
COVER SURGICAL LIGHT HANDLE (MISCELLANEOUS) ×3 IMPLANT
DRAPE LAPAROSCOPIC ABDOMINAL (DRAPES) ×3 IMPLANT
DRSG TEGADERM 2-3/8X2-3/4 SM (GAUZE/BANDAGES/DRESSINGS) ×2 IMPLANT
DRSG TEGADERM 4X4.75 (GAUZE/BANDAGES/DRESSINGS) ×3 IMPLANT
ELECT BLADE 4.0 EZ CLEAN MEGAD (MISCELLANEOUS) ×3
ELECT CAUTERY BLADE 6.4 (BLADE) ×3 IMPLANT
ELECT REM PT RETURN 9FT ADLT (ELECTROSURGICAL) ×3
ELECTRODE BLDE 4.0 EZ CLN MEGD (MISCELLANEOUS) IMPLANT
ELECTRODE REM PT RTRN 9FT ADLT (ELECTROSURGICAL) ×1 IMPLANT
GAUZE SPONGE 4X4 12PLY STRL (GAUZE/BANDAGES/DRESSINGS) ×3 IMPLANT
GLOVE BIO SURGEON STRL SZ7 (GLOVE) ×9 IMPLANT
GLOVE BIOGEL PI IND STRL 7.0 (GLOVE) IMPLANT
GLOVE BIOGEL PI IND STRL 7.5 (GLOVE) ×1 IMPLANT
GLOVE BIOGEL PI INDICATOR 7.0 (GLOVE) ×4
GLOVE BIOGEL PI INDICATOR 7.5 (GLOVE) ×2
GLOVE SURG SS PI 7.0 STRL IVOR (GLOVE) ×2 IMPLANT
GOWN STRL REUS W/ TWL LRG LVL3 (GOWN DISPOSABLE) ×2 IMPLANT
GOWN STRL REUS W/TWL LRG LVL3 (GOWN DISPOSABLE) ×6
KIT BASIN OR (CUSTOM PROCEDURE TRAY) ×3 IMPLANT
KIT ROOM TURNOVER OR (KITS) ×3 IMPLANT
MESH VENTRALEX ST 2.5 CRC MED (Mesh General) ×2 IMPLANT
NDL HYPO 25GX1X1/2 BEV (NEEDLE) ×1 IMPLANT
NEEDLE HYPO 25GX1X1/2 BEV (NEEDLE) ×3 IMPLANT
NS IRRIG 1000ML POUR BTL (IV SOLUTION) ×3 IMPLANT
PACK GENERAL/GYN (CUSTOM PROCEDURE TRAY) ×3 IMPLANT
PAD ARMBOARD 7.5X6 YLW CONV (MISCELLANEOUS) ×6 IMPLANT
STRIP CLOSURE SKIN 1/2X4 (GAUZE/BANDAGES/DRESSINGS) ×2 IMPLANT
SUT MNCRL AB 4-0 PS2 18 (SUTURE) ×5 IMPLANT
SUT NOVA NAB DX-16 0-1 5-0 T12 (SUTURE) ×2 IMPLANT
SUT NOVA NAB GS-21 0 18 T12 DT (SUTURE) ×2 IMPLANT
SUT NOVA NAB GS-21 1 T12 (SUTURE) IMPLANT
SUT VIC AB 3-0 SH 27 (SUTURE) ×6
SUT VIC AB 3-0 SH 27XBRD (SUTURE) ×1 IMPLANT
SYR CONTROL 10ML LL (SYRINGE) ×3 IMPLANT
TOWEL OR 17X24 6PK STRL BLUE (TOWEL DISPOSABLE) ×3 IMPLANT
TOWEL OR 17X26 10 PK STRL BLUE (TOWEL DISPOSABLE) ×3 IMPLANT

## 2016-02-12 NOTE — Interval H&P Note (Signed)
History and Physical Interval Note:  02/12/2016 7:21 AM  Ashley Collier  has presented today for surgery, with the diagnosis of SMALL VENTRAL INCISIONAL HERNIA  The various methods of treatment have been discussed with the patient and family. After consideration of risks, benefits and other options for treatment, the patient has consented to  Procedure(s): OPEN REPAIR OF VENTRAL INCISIONAL HERNIA (N/A) INSERTION OF MESH (N/A) as a surgical intervention .  The patient's history has been reviewed, patient examined, no change in status, stable for surgery.  I have reviewed the patient's chart and labs.  Questions were answered to the patient's satisfaction.     [name] K.

## 2016-02-12 NOTE — H&P (View-Only) (Signed)
History of Present Illness Ashley Collier. Ashley Aman MD; 01/14/2016 1:28 PM) Patient words: follow up.  The patient is a 65 year old female who presents with an abdominal wall hernia. This is a 65 yo female who has been previously evaluated for incisional hernia. She was referred by Dr. Bernerd Limbo who was evaluated last year for severe periumbilical pain and swelling. The patient has had a small ventral hernia around her umbilicus for the last two years. She was at work one morning when she noticed severe pain and swelling this area. They were able to reduce this in the emergency department. A CT scan showed that there was no bowel in the hernia. She is now referred for surgical evaluation. She has been relatively asymptomatic, but has been refraining from any heavy lifting. She postponed her surgery because of financial reasons.  She has a history of open gastric bypass surgery/ cholecystectomy in 1986.  Over the last several months, she has noted some vague mild discomfort in her right suprapubic region. There is not a clear bulge in this area but she does feel some fullness when compared to the left side. She denies any urinary or symptoms. She brought this to the attention of her PCP who has referred her back to [place] for evaluation. The patient is no longer working and has refraining from any heavy lifting. She is not having any significant symptoms from her periumbilical hernia.  I obtained a CT scan that showed no sign of inguinal hernia or other etiology for her right groin pain.  CLINICAL DATA: Bilateral groin pain for several months, history of gastric bypass  EXAM: CT ABDOMEN AND PELVIS WITH CONTRAST  TECHNIQUE: Multidetector CT imaging of the abdomen and pelvis was performed using the standard protocol following bolus administration of intravenous contrast.  CONTRAST: 153mL ISOVUE-300 IOPAMIDOL (ISOVUE-300) INJECTION 61%  COMPARISON: CT abdomen pelvis of 03/16/2014, and CT  abdomen pelvis of 01/06/2012  Creatinine was obtained on site at [facility] at [address]Results: Creatinine 0.4 mg/dL. GFR of 111.  FINDINGS: On images through the lung bases, the noncalcified nodules are stable. The largest nodule measures 8 mm just above the hemidiaphragm in the left lower lobe, unchanged in size. No pleural effusion is seen.  The liver enhances with no focal abnormality and no ductal dilatation is seen. Surgical clips are present from prior cholecystectomy. The pancreas is normal in size and the pancreatic duct is not dilated. The adrenal glands and the spleen are stable. Changes of gastric bypass are noted. The kidneys enhance with no calculus or mass and on delayed images, the pelvocaliceal systems are unremarkable. The focus of diminished attenuation within the posterior right kidney noted previously is not seen currently. The proximal ureters are normal in caliber. Moderate abdominal aortic atherosclerosis is present. No aneurysmal dilatation is seen.  The urinary bladder is not well distended but no abnormality is seen. The uterus has previously been resected. No adnexal lesion is seen. No fluid is noted within the pelvis. No abnormality of the colon is seen. The terminal ileum is unremarkable. The appendix is not visualized. No pelvic mass or adenopathy is seen. No groin adenopathy is noted. There is no evidence of inguinal hernia. There is prominence of the soft tissues of the lower pelvis anteriorly which may represent cellulitis. Clinical correlation is recommended. The lumbar vertebrae are normal alignment. Degenerative disc disease is present at L5- S1.  IMPRESSION: 1. No explanation for the patient's groin pain is seen. There is  however possible cellulitis of the superficial soft tissues of the lower pelvis. No adenopathy is seen. 2. Stable noncalcified lung nodules most consistent with a benign process. 3. Degenerative disc  disease at L5-S1.   Electronically Signed By: Ivar Drape M.D. On: 11/27/2015 16:47   The groin pain is slightly improved, but still present.   Allergies ([name], LPN; 075-GRM X33443 AM) Codeine Phosphate *ANALGESICS - OPIOID*  Medication History ([name], LPN; 075-GRM X33443 AM) Vitamin D3 (1000UNIT Capsule, Oral) Active. Ferrous Sulfate (325 (65 Fe)MG Tablet, Oral) Active. Omeprazole (10MG  Capsule DR, Oral) Active. Multiple Vitamin (Oral) Active. Valtrex (1GM Tablet, Oral) Active. Super B Complex Maxi (Oral) Active. Aspirin (81MG  Tablet Chewable, Oral) Active. Loratadine (10MG  (Rapid) Tablet, Oral) Active. AmLODIPine Besylate (10MG  Tablet, Oral) Active. Cyclobenzaprine HCl (10MG  Tablet, Oral) Active. Fluticasone Propionate (50MCG/ACT Suspension, Nasal) Active. Lisinopril (10MG  Tablet, Oral) Active. Meloxicam (15MG  Tablet, Oral) Active. MetFORMIN HCl (1000MG  Tablet, Oral) Active. Atorvastatin Calcium (80MG  Tablet, Oral) Active. TraMADol HCl (50MG  Tablet, Oral as needed) Active. Medications Reconciled    Vitals ([name] LPN; 075-GRM X33443 AM) 01/14/2016 9:45 AM Weight: 178 lb Height: 60in Body Surface Area: 1.78 m Body Mass Index: 34.76 kg/m  Temp.: 98.1F(Oral)  Pulse: 101 (Regular)  BP: 122/70 (Sitting, Left Arm, Standard)      Physical Exam Rodman Key K. Avarae Zwart MD; 01/14/2016 1:28 PM)  The physical exam findings are as follows: Note:WDWN in NAD Eyes: Pupils equal, round; sclera anicteric HENT: Oral mucosa moist; good dentition Neck: No masses palpated, no thyromegaly Lungs: CTA bilaterally; normal respiratory effort CV: Regular rate and rhythm; no murmurs; extremities well-perfused with no edema Abd: +bowel sounds, soft, non-tender, no palpable organomegaly; palpable hernia to the right of the umbilicus - not reducible Skin: Warm, dry; no sign of jaundice Psychiatric - alert and oriented x 4; calm  mood and affect    Assessment & Plan Rodman Key K. 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 The surgical procedure has been discussed with the patient. Potential risks, benefits, alternative treatments, and expected outcomes have been explained. All of the patient's questions at this time have been answered. The likelihood of reaching the patient's treatment goal is good. The patient understand the proposed surgical procedure and wishes to proceed.  Ashley Collier. Georgette Dover, MD, [facility] Surgery  General/ Trauma Surgery  01/14/2016 1:30 PM

## 2016-02-12 NOTE — Anesthesia Postprocedure Evaluation (Signed)
Anesthesia Post Note  Patient: Consandra Baray  Procedure(s) Performed: Procedure(s) (LRB): OPEN REPAIR OF VENTRAL INCISIONAL HERNIA (N/A) INSERTION OF MESH (N/A)  Patient location during evaluation: PACU Anesthesia Type: General Level of consciousness: awake and alert Pain management: pain level controlled Vital Signs Assessment: post-procedure vital signs reviewed and stable Respiratory status: spontaneous breathing, nonlabored ventilation and respiratory function stable Cardiovascular status: blood pressure returned to baseline and stable Postop Assessment: no signs of nausea or vomiting Anesthetic complications: no    Last Vitals:  Vitals:   02/12/16 0955 02/12/16 1008  BP: 125/68 (!) 150/69  Pulse:  93  Resp: 15 18  Temp: 36.7 C     Last Pain:  Vitals:   02/12/16 0930  TempSrc:   PainSc: 6                  Juda Toepfer,W. EDMOND

## 2016-02-12 NOTE — Discharge Instructions (Signed)
[facility] ° °HERNIA REPAIR: POST OP INSTRUCTIONS ° °Always review your discharge instruction sheet given to you by the facility where your surgery was performed. °IF YOU HAVE DISABILITY OR FAMILY LEAVE FORMS, YOU MUST BRING THEM TO THE OFFICE FOR PROCESSING.   °DO NOT GIVE THEM TO YOUR DOCTOR. ° °1. A  prescription for pain medication may be given to you upon discharge.  Take your pain medication as prescribed, if needed.  If narcotic pain medicine is not needed, then you may take acetaminophen (Tylenol) or ibuprofen (Advil) as needed. °2. Take your usually prescribed medications unless otherwise directed. °3. If you need a refill on your pain medication, please contact your pharmacy.  They will contact our office to request authorization. Prescriptions will not be filled after 5 pm or on week-ends. °4. You should follow a light diet the first 24 hours after arrival home, such as soup and crackers, etc.  Be sure to include lots of fluids daily.  Resume your normal diet the day after surgery. °5. Most patients will experience some swelling and bruising around the umbilicus or in the groin and scrotum.  Ice packs and reclining will help.  Swelling and bruising can take several days to resolve.  °6. It is common to experience some constipation if taking pain medication after surgery.  Increasing fluid intake and taking a stool softener (such as Colace) will usually help or prevent this problem from occurring.  A mild laxative (Milk of Magnesia or Miralax) should be taken according to package directions if there are no bowel movements after 48 hours. °7. Unless discharge instructions indicate otherwise, you may remove your bandages 24-48 hours after surgery, and you may shower at that time.  You will have steri-strips (small skin tapes) in place directly over the incision.  These strips should be left on the skin for 7-10 days. °8. ACTIVITIES:  You may resume regular (light) daily activities beginning  the next day--such as daily self-care, walking, climbing stairs--gradually increasing activities as tolerated.  You may have sexual intercourse when it is comfortable.  Refrain from any heavy lifting or straining until approved by your doctor. °a. You may drive when you are no longer taking prescription pain medication, you can comfortably wear a seatbelt, and you can safely maneuver your car and apply brakes. °b. RETURN TO WORK:  2-3 weeks with light duty - no lifting over 15 lbs. °9. You should see your doctor in the office for a follow-up appointment approximately 2-3 weeks after your surgery.  Make sure that you call for this appointment within a day or two after you arrive home to insure a convenient appointment time. °10. OTHER INSTRUCTIONS:  __________________________________________________________________________________________________________________________________________________________________________________________  °WHEN TO CALL YOUR DOCTOR: °1. Fever over 101.0 °2. Inability to urinate °3. Nausea and/or vomiting °4. Extreme swelling or bruising °5. Continued bleeding from incision. °6. Increased pain, redness, or drainage from the incision ° °The clinic staff is available to answer your questions during regular business hours.  Please don’t hesitate to call and ask to speak to one of the nurses for clinical concerns.  If you have a medical emergency, go to the nearest emergency room or call 911.  A surgeon from [facility] is always on call at the hospital ° ° °[address], [address], [place], [place]  [address] ? ° [address] [address], [place], [place]   [address] °[phone]    [phone]    FAX [phone] °Web site: [email or web address] ° ° °

## 2016-02-12 NOTE — Transfer of Care (Signed)
Immediate Anesthesia Transfer of Care Note  Patient: Ashley Collier  Procedure(s) Performed: Procedure(s): OPEN REPAIR OF VENTRAL INCISIONAL HERNIA (N/A) INSERTION OF MESH (N/A)  Patient Location: PACU  Anesthesia Type:General  Level of Consciousness: awake, alert , oriented and patient cooperative  Airway & Oxygen Therapy: Patient Spontanous Breathing  Post-op Assessment: Report given to RN and Post -op Vital signs reviewed and stable  Post vital signs: Reviewed and stable  Last Vitals:  Vitals:   02/12/16 0636  BP: 125/70  Pulse: 86  Resp: 20  Temp: 36.7 C    Last Pain:  Vitals:   02/12/16 0636  TempSrc: Oral         Complications: No apparent anesthesia complications

## 2016-02-12 NOTE — Op Note (Signed)
Ventral Hernia Repair with meshProcedure Note  Indications: Symptomatic ventral hernia - incisional; supraumbilical  Pre-operative Diagnosis: Supraumbilical ventral incisional hernia  Post-operative Diagnosis: Same  Surgeon: Christophere Hillhouse K.   Assistants: none  Anesthesia: General endotracheal anesthesia  ASA Class: 2  Procedure Details  The patient was seen in the Holding Room. The risks, benefits, complications, treatment options, and expected outcomes were discussed with the patient. The possibilities of reaction to medication, pulmonary aspiration, perforation of viscus, bleeding, recurrent infection, the need for additional procedures, failure to diagnose a condition, and creating a complication requiring transfusion or operation were discussed with the patient. The patient concurred with the proposed plan, giving informed consent.  The site of surgery properly noted/marked. The patient was taken to the operating room, identified as [facility] and the procedure verified as ventral hernia repair. A Time Out was held and the above information confirmed.  The patient was placed supine.  After establishing general anesthesia, the abdomen was prepped with Chloraprep and draped in sterile fashion.  We made a vertical incision over the palpable hernia above and to the right of the umbilicus.  We made an 8 cm incision. Dissection was carried down to the hernia sac located above the fascia and mobilized from surrounding structures.  Intact fascia was identified circumferentially around the defect.  The defect measured only 2 cm across.  We used a medium Ventralex mesh and secured this to the fascia with interrupted 0 Novofil sutures.  The fascial defect was reapproximated with interrupted figure-of-8 1 Novofil sutures.  The subcutaneous tissues were irrigated.  Hemostasis was confirmed.  The subcutaneous tissues were closed with 3-0 Vicryl.The skin incision was closed with a 4-0 Vicryl subcuticular  closure.  Steri-Strips were applied at the end of the operation.    Instrument, sponge, and needle counts were correct prior to closure and at the conclusion of the case.   Findings: 2 cm defect; large hernia sac  Estimated Blood Loss:  Minimal         Drains: none                      Complications:  None; patient tolerated the procedure well.         Disposition: PACU - hemodynamically stable.         Condition: stable  Imogene Burn. Georgette Dover, MD, [facility]  General/ Trauma Surgery  02/12/2016 8:51 AM

## 2016-02-13 ENCOUNTER — Encounter (HOSPITAL_COMMUNITY): Payer: Self-pay | Admitting: Surgery

## 2016-11-11 ENCOUNTER — Telehealth: Payer: Self-pay | Admitting: Cardiology

## 2016-11-11 NOTE — Telephone Encounter (Signed)
Received records from [facility] for appointment on 12/02/16 with Dr Ellyn Hack.  Records put with Dr Darcus Pester' schedule for 12/02/16. lp

## 2016-12-02 ENCOUNTER — Encounter: Payer: Self-pay | Admitting: Cardiology

## 2016-12-02 ENCOUNTER — Ambulatory Visit (INDEPENDENT_AMBULATORY_CARE_PROVIDER_SITE_OTHER): Payer: BC Managed Care – PPO | Admitting: Cardiology

## 2016-12-02 VITALS — BP 134/72 | HR 93 | Ht 60.0 in | Wt 179.8 lb

## 2016-12-02 DIAGNOSIS — R079 Chest pain, unspecified: Secondary | ICD-10-CM | POA: Insufficient documentation

## 2016-12-02 DIAGNOSIS — R6889 Other general symptoms and signs: Secondary | ICD-10-CM | POA: Diagnosis not present

## 2016-12-02 DIAGNOSIS — E785 Hyperlipidemia, unspecified: Secondary | ICD-10-CM

## 2016-12-02 DIAGNOSIS — I10 Essential (primary) hypertension: Secondary | ICD-10-CM

## 2016-12-02 DIAGNOSIS — E118 Type 2 diabetes mellitus with unspecified complications: Secondary | ICD-10-CM | POA: Insufficient documentation

## 2016-12-02 DIAGNOSIS — R0609 Other forms of dyspnea: Secondary | ICD-10-CM | POA: Insufficient documentation

## 2016-12-02 NOTE — Patient Instructions (Signed)
SCHEDULE AT [facility] Your physician has requested that you have en exercise stress myoview. For further information please visit [email or web address]. Please follow instruction sheet, as given.    NO CHANGE WITH CURRENT MEDIATIONS    Your physician recommends that you schedule a follow-up appointment in [place].

## 2016-12-02 NOTE — Progress Notes (Signed)
PCP: Bernerd Limbo, MD  Clinic Note: Chief Complaint  Patient presents with  . New Patient (Initial Visit)    pt c/o SOB on exertion, pt denied chest pain  . Shortness of Breath    exertion    HPI: Ashley Collier is a 66 y.o. female who is being seen today for the evaluation of Dyspnea on Exertion at the request of Bernerd Limbo, MD.  Hardie Shackleton was last seen on none  Recent Hospitalizations: none  Studies Personally Reviewed - (if available, images/films reviewed: From Epic Chart or Care Everywhere)  none  Interval History: Ashley Collier presents today noting exertional shortness of breath that seems to have started back in late June -- the day after she had a long episode of central back pain while doing exercises with her sister.  Beginning the following day, she started to note dyspnea with doing her routine exercise - started off that she noted it going up & down stairs.  Leading up to the onset of symptoms, she had worked up to doing several rounds of exercise including stairs at least 2-3 x per week.  Her symptoms have progressed to the point that she has to stop less than 1/2 way through her usual activity.  She states that she just cannot seem to get a good deep breath - noting that if she kept going, her chest would feel tight & she felt like she was struggling for breathe.  Now she has to stop just 1/2 way up the flight of stairs.   When not using stairs she is unable to complete one lap around the apartment complex (equivalent of ~7 blocks).   She has not noted any resting or exertional chest pain or palpitations beyond feeling that he chest was tight tryoing to take a breath.  No baseline CHF symptoms PND, orthopnea.  She has noted some leg swelling at baseline & usually uses support stockings that take care of it. No palpitations, lightheadedness, dizziness, weakness or syncope/near syncope. No TIA/amaurosis fugax symptoms. No melena, hematochezia, hematuria, or epstaxis. No  claudication.  ROS: A comprehensive was performed. Review of Systems  Constitutional: Negative for diaphoresis, fever and malaise/fatigue (just exercise intolerance).  HENT: Negative for congestion, nosebleeds and sinus pain.   Respiratory: Positive for shortness of breath and wheezing. Negative for sputum production.   Cardiovascular:       Per HPI  Genitourinary: Negative for flank pain, frequency and hematuria.  Musculoskeletal: Negative for back pain, falls and joint pain.  Skin: Negative.   Neurological: Negative for dizziness, tingling, focal weakness, seizures, weakness and headaches.  Psychiatric/Behavioral: Negative for memory loss and substance abuse. The patient is not nervous/anxious and does not have insomnia.   All other systems reviewed and are negative.  I have reviewed and (if needed) personally updated the patient's problem list, medications, allergies, past medical and surgical history, social and family history.   Past Medical History:  Diagnosis Date  . Allergic rhinitis, cause unspecified   . Allergy   . Anemia   . Arthritis   . Body mass index 35.0-35.9, adult   . Essential hypertension, benign   . Family history of adverse reaction to anesthesia    nephew - difficulty waking up after surgery 5-6 years ago  . Obesity, unspecified   . 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 Pure hypercholesterolemia   . Reflux esophagitis   . Screening for  other eye conditions   . Type II or unspecified type diabetes mellitus without mention of complication, not stated as uncontrolled     Past Surgical History:  Procedure Laterality Date  . ABDOMINAL HYSTERECTOMY     2001  . CESAREAN SECTION     [id number]  . CHOLECYSTECTOMY     1986  . COLONOSCOPY    . GASTRIC BYPASS     1986  . INSERTION OF MESH N/A 02/12/2016   Procedure: INSERTION OF MESH;  Surgeon: Donnie Mesa, MD;  Location: [facility];  Service: General;   Laterality: N/A;  . TUBAL LIGATION    . VENTRAL HERNIA REPAIR N/A 02/12/2016   Procedure: OPEN REPAIR OF VENTRAL INCISIONAL HERNIA;  Surgeon: Donnie Mesa, MD;  Location: [facility];  Service: General;  Laterality: N/A;    Current Meds  Medication Sig  . amLODipine (NORVASC) 10 MG tablet Take 10 mg by mouth daily.  Marland Kitchen aspirin EC 81 MG tablet Take 81 mg by mouth daily.  Marland Kitchen atorvastatin (LIPITOR) 80 MG tablet Take 80 mg by mouth at bedtime.   Marland Kitchen b complex vitamins tablet Take 1 tablet by mouth daily.  . Cholecalciferol (VITAMIN D) 2000 UNITS CAPS Take 2,000 Units by mouth every morning.  . cyclobenzaprine (FLEXERIL) 10 MG tablet Take 10 mg by mouth at bedtime as needed for muscle spasms.   . Ferrous Sulfate Dried (SLOW RELEASE IRON) 45 MG TBCR Take 45 mg by mouth daily.  . fluticasone (FLONASE) 50 MCG/ACT nasal spray Place 1 spray into both nostrils daily.   Marland Kitchen lisinopril (PRINIVIL,ZESTRIL) 10 MG tablet Take 10 mg by mouth daily.  Marland Kitchen loratadine (CLARITIN) 10 MG tablet Take 10 mg by mouth daily.  . metFORMIN (GLUCOPHAGE) 1000 MG tablet Take 1,000 mg by mouth 2 (two) times daily with a meal.  . Multiple Vitamin (MULTIVITAMIN) capsule Take 1 capsule by mouth daily.  Marland Kitchen omeprazole (PRILOSEC) 20 MG capsule Take 20 mg by mouth at bedtime.  . valACYclovir (VALTREX) 1000 MG tablet Take 1,000 mg by mouth 2 (two) times daily as needed (cold sores).   . [DISCONTINUED] HYDROcodone-acetaminophen (NORCO/VICODIN) 5-325 MG tablet Take 1-2 tablets by mouth every 6 (six) hours as needed for moderate pain.  . [DISCONTINUED] meloxicam (MOBIC) 15 MG tablet Take 15 mg by mouth at bedtime.     Allergies  Allergen Reactions  . Codeine Other (See Comments)    Hallucinations     Social History   Social History  . Marital status: Married    Spouse name: N/A  . Number of children: 2  . Years of education: N/A   Occupational History  .  [employer]   Social History Main Topics  . Smoking status: Never Smoker  .  Smokeless tobacco: Never Used  . Alcohol use Yes     Comment: rare  . Drug use: No  . Sexual activity: Not Asked   Other Topics Concern  . None   Social History Narrative  . None    family history includes CAD (age of onset: 76) in her mother; COPD in her mother; CVA in her father; Diabetes in her mother; Emphysema in her father.  Wt Readings from Last 3 Encounters:  12/02/16 179 lb 12.8 oz (81.6 kg)  02/12/16 178 lb (80.7 kg)  02/08/16 178 lb 12.8 oz (81.1 kg)    PHYSICAL EXAM BP 134/72   Pulse 93   Ht 5' (1.524 m)   Wt 179 lb 12.8 oz (81.6 kg)   BMI  35.11 kg/m  Physical Exam  Constitutional: She is oriented to person, place, and time. She appears well-developed and well-nourished. No distress.  Well-groomed  HENT:  Head: Normocephalic and atraumatic.  Eyes: Pupils are equal, round, and reactive to light. EOM are normal. Right eye exhibits discharge. Left eye exhibits no discharge. No scleral icterus.  Neck: Normal range of motion. Neck supple. No JVD present. No tracheal deviation present. Thyromegaly: none.  Cardiovascular: Normal rate, regular rhythm, normal heart sounds and intact distal pulses.  Exam reveals no gallop and no friction rub.   No murmur heard. Pulmonary/Chest: Effort normal and breath sounds normal. No respiratory distress. She has no wheezes. She has no rales. She exhibits no tenderness.  Abdominal: Soft. Bowel sounds are normal. She exhibits no distension. There is no tenderness. There is no rebound.  Musculoskeletal: She exhibits no edema.  Lymphadenopathy:    She has no cervical adenopathy.  Neurological: She is alert and oriented to person, place, and time. A cranial nerve deficit is present.  Skin: Skin is warm and dry.  Psychiatric: She has a normal mood and affect. Her behavior is normal. Thought content normal.  Nursing note and vitals reviewed.    Adult ECG Report  Rate: 93 ;  Rhythm: normal sinus rhythm and normal axis, intervals and  durations;   Narrative Interpretation: normal EKG   Other studies Reviewed: Additional studies/ records that were reviewed today include:  Recent Labs:   Lab Results  Component Value Date   CREATININE 0.54 02/08/2016   BUN 12 02/08/2016   NA 139 02/08/2016   K 3.9 02/08/2016   CL 108 02/08/2016   CO2 20 (L) 02/08/2016     ASSESSMENT / PLAN: Problem List Items Addressed This Visit    Chest pain on exertion    She noted a tightness sensation in her chest more associated with dyspnea than something likee angina, however somewhat atypical and therefore will exclude ischemia with Myoview stress test.      Relevant Orders   EKG 12-Lead (Completed)   Myocardial Perfusion Imaging   Dyspnea on exertion - Primary    Concerning symptoms for progressively worsening exertional dyspnea. Cannot exclude myocardial ischemia. Plan:  Treadmill Myoview Follow-up after results. Low threshold to proceed with cardiac catheterization      Relevant Orders   EKG 12-Lead (Completed)   Myocardial Perfusion Imaging   Essential hypertension (Chronic)    Blood pressure looks well controlled on amlodipine and lisinopril. For now no change      Exercise intolerance    She went for me to do her routine activity to on walk about half a flight of steps. Concerning for cardiac ischemia. Plan: Treadmill Myoview stress test      Relevant Orders   EKG 12-Lead (Completed)   Myocardial Perfusion Imaging   Hyperlipidemia with target low density lipoprotein (LDL) cholesterol less than 100 mg/dL (Chronic)    Monitored by PCP at present. On atorvastatin high-dose.         Current medicines are reviewed at length with the patient today. (+/- concerns) none The following changes have been made: none  Patient Instructions  SCHEDULE AT [facility] has requested that you have en exercise stress myoview. For further information please visit [email or web address]. Please follow  instruction sheet, as given.    NO CHANGE WITH CURRENT MEDIATIONS    Your physician recommends that you schedule a follow-up appointment in [place].  Studies Ordered:   Orders Placed This Encounter  Procedures  . Myocardial Perfusion Imaging  . EKG 12-Lead      Glenetta Hew, M.D., M.S. Interventional Cardiologist   Pager # [phone] Phone # [phone] [address] [place], [place] [address]

## 2016-12-03 ENCOUNTER — Encounter: Payer: Self-pay | Admitting: Cardiology

## 2016-12-03 DIAGNOSIS — I10 Essential (primary) hypertension: Secondary | ICD-10-CM | POA: Insufficient documentation

## 2016-12-03 DIAGNOSIS — E785 Hyperlipidemia, unspecified: Secondary | ICD-10-CM | POA: Insufficient documentation

## 2016-12-03 NOTE — Assessment & Plan Note (Signed)
Concerning symptoms for progressively worsening exertional dyspnea. Cannot exclude myocardial ischemia. Plan:  Treadmill Myoview Follow-up after results. Low threshold to proceed with cardiac catheterization

## 2016-12-03 NOTE — Assessment & Plan Note (Signed)
Monitored by PCP at present. On atorvastatin high-dose.

## 2016-12-03 NOTE — Assessment & Plan Note (Signed)
Blood pressure looks well controlled on amlodipine and lisinopril. For now no change

## 2016-12-03 NOTE — Assessment & Plan Note (Signed)
She went for me to do her routine activity to on walk about half a flight of steps. Concerning for cardiac ischemia. Plan: Treadmill Myoview stress test

## 2016-12-03 NOTE — Assessment & Plan Note (Signed)
She noted a tightness sensation in her chest more associated with dyspnea than something likee angina, however somewhat atypical and therefore will exclude ischemia with Myoview stress test.

## 2016-12-04 ENCOUNTER — Telehealth (HOSPITAL_COMMUNITY): Payer: Self-pay

## 2016-12-04 NOTE — Telephone Encounter (Signed)
Encounter complete. 

## 2016-12-09 ENCOUNTER — Ambulatory Visit (HOSPITAL_COMMUNITY)
Admission: RE | Admit: 2016-12-09 | Discharge: 2016-12-09 | Disposition: A | Discharge status: Home / Self Care | Payer: BC Managed Care – PPO | Source: Ambulatory Visit | Attending: Cardiovascular Disease | Admitting: Cardiovascular Disease

## 2016-12-09 DIAGNOSIS — R0609 Other forms of dyspnea: Secondary | ICD-10-CM | POA: Insufficient documentation

## 2016-12-09 DIAGNOSIS — R6889 Other general symptoms and signs: Secondary | ICD-10-CM | POA: Insufficient documentation

## 2016-12-09 DIAGNOSIS — R079 Chest pain, unspecified: Secondary | ICD-10-CM | POA: Diagnosis not present

## 2016-12-09 LAB — MYOCARDIAL PERFUSION IMAGING
CHL CUP NUCLEAR SRS: 1
CHL CUP NUCLEAR SSS: 2
CSEPPHR: 110 {beats}/min
LVDIAVOL: 73 mL (ref 46–106)
LVSYSVOL: 24 mL
Rest HR: 88 {beats}/min
SDS: 1
TID: 1.03

## 2016-12-09 MED ORDER — TECHNETIUM TC 99M TETROFOSMIN IV KIT
30.6000 | PACK | Freq: Once | INTRAVENOUS | Status: AC | PRN
  Administered 2016-12-09: 30.6 via INTRAVENOUS
  Filled 2016-12-09: qty 31

## 2016-12-09 MED ORDER — REGADENOSON 0.4 MG/5ML IV SOLN
0.4000 mg | Freq: Once | INTRAVENOUS | Status: AC
  Administered 2016-12-09: 0.4 mg via INTRAVENOUS

## 2016-12-09 MED ORDER — TECHNETIUM TC 99M TETROFOSMIN IV KIT
10.3000 | PACK | Freq: Once | INTRAVENOUS | Status: AC | PRN
  Administered 2016-12-09: 10.3 via INTRAVENOUS
  Filled 2016-12-09: qty 11

## 2016-12-10 NOTE — Progress Notes (Signed)
Stress Test looked good!! No sign of significant Heart Artery Disease.  Pump function is normal.  Good news!!.  HARDING,DAVID W, MD  pls forward to PCP: Bernerd Limbo, MD

## 2017-01-02 ENCOUNTER — Ambulatory Visit (INDEPENDENT_AMBULATORY_CARE_PROVIDER_SITE_OTHER): Payer: Medicare Other | Admitting: Cardiology

## 2017-01-02 ENCOUNTER — Encounter: Payer: Self-pay | Admitting: Cardiology

## 2017-01-02 DIAGNOSIS — R6889 Other general symptoms and signs: Secondary | ICD-10-CM

## 2017-01-02 DIAGNOSIS — I10 Essential (primary) hypertension: Secondary | ICD-10-CM

## 2017-01-02 DIAGNOSIS — E785 Hyperlipidemia, unspecified: Secondary | ICD-10-CM | POA: Diagnosis not present

## 2017-01-02 DIAGNOSIS — R0609 Other forms of dyspnea: Secondary | ICD-10-CM | POA: Diagnosis not present

## 2017-01-02 DIAGNOSIS — R079 Chest pain, unspecified: Secondary | ICD-10-CM | POA: Diagnosis not present

## 2017-01-02 NOTE — Progress Notes (Signed)
PCP: Bernerd Limbo, MD  Clinic Note: Chief Complaint  Patient presents with  . Follow-up    1 month; results of Myoview  . Shortness of Breath    HPI: Ashley Collier is a 66 y.o. female who is being seen today for the follow-up evaluation of Dyspnea on Exertion at the request of Bernerd Limbo, MD.  Ashley Collier was initially seen on 12/02/2016 for evaluation of exertional dyspnea at the request of Dr. Coletta Memos. --> She was evaluated with a Myoview stress test.  Recent Hospitalizations: none  Studies Personally Reviewed - (if available, images/films reviewed: From Epic Chart or Care Everywhere)  Myoview stress test 12/10/2016: No ischemia or infarction. Normal stress test. EF 67%. LOW RISK  Interval History: Ashley Collier presents today noting that she is definitely feeling better. She is able to walk now around her apartment complex without too much difficulty ever since hearing the results of her stress test. She has not yet started taking the stairs however. With the walking that she is doing, she is no longer noticing the dyspnea and thinks that she must just been going through a bad spell. Per Thinks it was related to her reading Darrick Meigs fiction book that entertained thoughts of Satan. Now that she has stopped reading it she is starting to feel better.  One thing she did note was that her stress test initially being a treadmill was stopped because of her inability to reach target heart rate. She has not had any resting or exertional chest tightness or pressure. No PND, orthopnea or edema. No rapid irregular heartbeats or palpitations. No syncope/near syncope or TIA/amaurosis fugax. No claudication  ROS: A comprehensive was performed. Review of Systems  Constitutional: Negative for diaphoresis, fever and malaise/fatigue (just exercise intolerance).  HENT: Negative for congestion, nosebleeds and sinus pain.   Respiratory: Positive for shortness of breath and wheezing. Negative for sputum  production.   Cardiovascular:       Per HPI  Gastrointestinal: Negative for blood in stool and melena.  Genitourinary: Negative for flank pain, frequency and hematuria.  Musculoskeletal: Negative for back pain, falls and joint pain.  Skin: Negative.   Neurological: Negative for dizziness, tingling, focal weakness, seizures, weakness and headaches.  Psychiatric/Behavioral: Negative for memory loss and substance abuse. The patient is not nervous/anxious and does not have insomnia.   All other systems reviewed and are negative.  I have reviewed and (if needed) personally updated the patient's problem list, medications, allergies, past medical and surgical history, social and family history.   Past Medical History:  Diagnosis Date  . Allergic rhinitis, cause unspecified   . Allergy   . Anemia   . Arthritis   . Body mass index 35.0-35.9, adult   . Essential hypertension, benign   . Family history of adverse reaction to anesthesia    nephew - difficulty waking up after surgery 5-6 years ago  . Obesity, unspecified   . 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 Pure hypercholesterolemia   . Reflux esophagitis   . Screening for other eye conditions   . Type II or unspecified type diabetes mellitus without mention of complication, not stated as uncontrolled     Past Surgical History:  Procedure Laterality Date  . ABDOMINAL HYSTERECTOMY     2001  . CESAREAN SECTION     [id number]  . CHOLECYSTECTOMY     1986  . COLONOSCOPY    .  GASTRIC BYPASS     1986  . INSERTION OF MESH N/A 02/12/2016   Procedure: INSERTION OF MESH;  Surgeon: Donnie Mesa, MD;  Location: [facility];  Service: General;  Laterality: N/A;  . TUBAL LIGATION    . VENTRAL HERNIA REPAIR N/A 02/12/2016   Procedure: OPEN REPAIR OF VENTRAL INCISIONAL HERNIA;  Surgeon: Donnie Mesa, MD;  Location: [facility];  Service: General;  Laterality: N/A;    Current Meds  Medication Sig    . Acetaminophen (TYLENOL EXTRA STRENGTH PO) Take 650 mg by mouth 2 (two) times daily.  Marland Kitchen amLODipine (NORVASC) 10 MG tablet Take 10 mg by mouth daily.  Marland Kitchen aspirin EC 81 MG tablet Take 81 mg by mouth daily.  Marland Kitchen atorvastatin (LIPITOR) 80 MG tablet Take 80 mg by mouth at bedtime.   Marland Kitchen b complex vitamins tablet Take 1 tablet by mouth daily.  . Cholecalciferol (VITAMIN D) 2000 UNITS CAPS Take 2,000 Units by mouth every morning.  . cyclobenzaprine (FLEXERIL) 10 MG tablet Take 10 mg by mouth at bedtime as needed for muscle spasms.   . Ferrous Sulfate Dried (SLOW RELEASE IRON) 45 MG TBCR Take 45 mg by mouth daily.  . fluticasone (FLONASE) 50 MCG/ACT nasal spray Place 1 spray into both nostrils daily.   Marland Kitchen lisinopril (PRINIVIL,ZESTRIL) 10 MG tablet Take 10 mg by mouth daily.  Marland Kitchen loratadine (CLARITIN) 10 MG tablet Take 10 mg by mouth daily.  . metFORMIN (GLUCOPHAGE) 1000 MG tablet Take 1,000 mg by mouth 2 (two) times daily with a meal.  . Multiple Vitamin (MULTIVITAMIN) capsule Take 1 capsule by mouth daily.  Marland Kitchen omeprazole (PRILOSEC) 20 MG capsule Take 20 mg by mouth at bedtime.  . valACYclovir (VALTREX) 1000 MG tablet Take 1,000 mg by mouth 2 (two) times daily as needed (cold sores).     Allergies  Allergen Reactions  . Codeine Other (See Comments)    Hallucinations     Social History   Social History  . Marital status: Married    Spouse name: N/A  . Number of children: 2  . Years of education: N/A   Occupational History  .  [employer]   Social History Main Topics  . Smoking status: Never Smoker  . Smokeless tobacco: Never Used  . Alcohol use Yes     Comment: rare  . Drug use: No  . Sexual activity: Not Asked   Other Topics Concern  . None   Social History Narrative  . None    family history includes CAD (age of onset: 34) in her mother; COPD in her mother; CVA in her father; Diabetes in her mother; Emphysema in her father.  Wt Readings from Last 3 Encounters:  01/02/17 179 lb (81.2  kg)  12/09/16 179 lb (81.2 kg)  12/02/16 179 lb 12.8 oz (81.6 kg)    PHYSICAL EXAM BP 132/86   Pulse 80   Ht 5' (1.524 m)   Wt 179 lb (81.2 kg)   BMI 34.96 kg/m  Physical Exam  Constitutional: She is oriented to person, place, and time. She appears well-developed and well-nourished. No distress.  Well-groomed  HENT:  Head: Normocephalic and atraumatic.  Neck: Normal range of motion. Neck supple. No JVD present. Carotid bruit is not present. Thyromegaly: none.  Cardiovascular: Normal rate, regular rhythm, normal heart sounds and intact distal pulses.  Exam reveals no gallop and no friction rub.   No murmur heard. Pulmonary/Chest: Effort normal and breath sounds normal. No respiratory distress. She has no wheezes.  She has no rales. She exhibits no tenderness.  Abdominal: Soft. Bowel sounds are normal. She exhibits no distension. There is no tenderness. There is no rebound.  Truncal obesity  Musculoskeletal: Normal range of motion. She exhibits no edema.  Neurological: She is alert and oriented to person, place, and time.  Skin: Skin is warm and dry.  Psychiatric: She has a normal mood and affect. Her behavior is normal. Thought content normal.  Nursing note and vitals reviewed.    Adult ECG Report  Rate: 93 ;  Rhythm: normal sinus rhythm and normal axis, intervals and durations;   Narrative Interpretation: normal EKG   Other studies Reviewed: Additional studies/ records that were reviewed today include:  Recent Labs:   Lab Results  Component Value Date   CREATININE 0.54 02/08/2016   BUN 12 02/08/2016   NA 139 02/08/2016   K 3.9 02/08/2016   CL 108 02/08/2016   CO2 20 (L) 02/08/2016    ASSESSMENT / PLAN: Problem List Items Addressed This Visit    Chest pain on exertion    But she really noted was more but tightness sensation of difficulty breathing on exertion which she is not complaining of any more. With negative Myoview, my inclination would be to simply  monitor.  Recommended that she continue working on her exercise and focus on  weight loss.      Dyspnea on exertion    Low risk stress test with no evidence of ischemia. The interesting feature was that she did not reach her heart rate on her treadmill. This would suggest maybe she has some chronotropic incompetence that could explain it. She is not on a medicine classically thought of as a rate control agent, however amlodipine could potentially be related to bradycardia.  Symptoms persist, we may need to consider titrating up her lisinopril and down amlodipine.      Essential hypertension (Chronic)    Well-controlled on amlodipine and lisinopril.      Exercise intolerance    I suspect that she may actually be dealing with some chronotropic incompetence, based on her inability to reach target heart rate on treadmill.        Hyperlipidemia with target low density lipoprotein (LDL) cholesterol less than 100 mg/dL (Chronic)    Monitored by PCP. She is currently on high-dose atorvastatin.         Current medicines are reviewed at length with the patient today. (+/- concerns) none The following changes have been made: none  Patient Instructions  NO CHANGE WITH MEDICATION    GRADUALLY  INCREASE EXERCISE ROUTINE     Your physician wants you to follow-up in [place]. You will receive a reminder letter in the mail two months in advance. If you don't receive a letter, please call our office to schedule the follow-up appointment.    Studies Ordered:   No orders of the defined types were placed in this encounter.     Ashley Collier, M.D., M.S. Interventional Cardiologist   Pager # [phone] Phone # [phone] [address] [place], [place] [address]

## 2017-01-02 NOTE — Patient Instructions (Signed)
NO CHANGE WITH MEDICATION    GRADUALLY  INCREASE EXERCISE ROUTINE     Your physician wants you to follow-up in [place]. You will receive a reminder letter in the mail two months in advance. If you don't receive a letter, please call our office to schedule the follow-up appointment.

## 2017-01-03 ENCOUNTER — Encounter: Payer: Self-pay | Admitting: Cardiology

## 2017-01-03 NOTE — Assessment & Plan Note (Signed)
Low risk stress test with no evidence of ischemia. The interesting feature was that she did not reach her heart rate on her treadmill. This would suggest maybe she has some chronotropic incompetence that could explain it. She is not on a medicine classically thought of as a rate control agent, however amlodipine could potentially be related to bradycardia.  Symptoms persist, we may need to consider titrating up her lisinopril and down amlodipine.

## 2017-01-03 NOTE — Assessment & Plan Note (Signed)
Monitored by PCP. She is currently on high-dose atorvastatin.

## 2017-01-03 NOTE — Assessment & Plan Note (Signed)
Well-controlled on amlodipine and lisinopril. 

## 2017-01-03 NOTE — Assessment & Plan Note (Signed)
But she really noted was more but tightness sensation of difficulty breathing on exertion which she is not complaining of any more. With negative Myoview, my inclination would be to simply monitor.  Recommended that she continue working on her exercise and focus on  weight loss.

## 2017-01-03 NOTE — Assessment & Plan Note (Signed)
I suspect that she may actually be dealing with some chronotropic incompetence, based on her inability to reach target heart rate on treadmill.

## 2017-09-10 ENCOUNTER — Ambulatory Visit: Payer: Medicare Other | Admitting: Cardiology

## 2017-10-14 ENCOUNTER — Ambulatory Visit: Payer: Medicare Other | Admitting: Cardiology

## 2017-10-14 ENCOUNTER — Encounter: Payer: Self-pay | Admitting: Cardiology

## 2017-10-14 VITALS — BP 128/62 | HR 68 | Ht 59.0 in | Wt 175.0 lb

## 2017-10-14 DIAGNOSIS — E785 Hyperlipidemia, unspecified: Secondary | ICD-10-CM | POA: Diagnosis not present

## 2017-10-14 DIAGNOSIS — I10 Essential (primary) hypertension: Secondary | ICD-10-CM | POA: Diagnosis not present

## 2017-10-14 DIAGNOSIS — R079 Chest pain, unspecified: Secondary | ICD-10-CM

## 2017-10-14 DIAGNOSIS — R0609 Other forms of dyspnea: Secondary | ICD-10-CM | POA: Diagnosis not present

## 2017-10-14 DIAGNOSIS — R6889 Other general symptoms and signs: Secondary | ICD-10-CM | POA: Diagnosis not present

## 2017-10-14 NOTE — Patient Instructions (Signed)
Dr Ellyn Hack recommends that you follow-up with him as needed.

## 2017-10-14 NOTE — Progress Notes (Signed)
PCP: Bernerd Limbo, MD  Clinic Note: Chief Complaint  Patient presents with  . Follow-up    Reassessment  . Shortness of Breath    Notably improved   HPI: Ashley Collier is a 67 y.o. female who is being seen today for the follow-up evaluation of Dyspnea on Exertion at the request of Bernerd Limbo, MD.  Ashley Collier was initially seen on 12/02/2016 for evaluation of exertional dyspnea at the request of Dr. Coletta Memos. --> She was evaluated with a Myoview stress test. I saw her back in October to discuss results  Recent Hospitalizations: none  Studies Personally Reviewed - (if available, images/films reviewed: From Epic Chart or Care Everywhere)  Myoview stress test 12/10/2016: No ischemia or infarction. Normal stress test. EF 67%. LOW RISK  Interval History: Ashley Collier presents today noting that really has not had any more episodes of this short of breath and palpitation type spells that she been having before.  She is not taking the stairs more because muscular skeletal issues.  But she is walking around the neighborhood now without any significant difficulty.  She is happy to be "free of shortness of breath.  She has been trying to keep up with hydration to avoid feels like she is doing better with her fluid intake and therefore has not had as much the dizziness. She has been really busy of late and has been on to get the exercise and that she wanted to because her sister has been dealing with chemotherapy and radiation for recent diagnosis of cancer.  Hopefully she can begin to get back into her exercise routine. She has not had any anginal symptoms with rest or exertion.  No recurrent rapid irregular heartbeats or palpitations.  No syncope/near syncope or TIA/amaurosis fugax. No claudication  ROS: A comprehensive was performed. Review of Systems  Constitutional: Negative for malaise/fatigue (improving exercise intolerance).  HENT: Negative for congestion and nosebleeds.   Respiratory: Negative  for cough and wheezing.   Cardiovascular: Negative for leg swelling.       Per HPI  Gastrointestinal: Negative for blood in stool and melena.  Genitourinary: Negative for hematuria.  Musculoskeletal: Negative for back pain, falls and joint pain.  Neurological: Positive for dizziness (Per HPI). Negative for focal weakness, weakness and headaches.  Psychiatric/Behavioral: Negative for depression. The patient is not nervous/anxious.   All other systems reviewed and are negative.  I have reviewed and (if needed) personally updated the patient's problem list, medications, allergies, past medical and surgical history, social and family history.   Past Medical History:  Diagnosis Date  . Allergic rhinitis, cause unspecified   . Allergy   . Anemia   . Arthritis   . Body mass index 35.0-35.9, adult   . Essential hypertension, benign   . Family history of adverse reaction to anesthesia    nephew - difficulty waking up after surgery 5-6 years ago  . Obesity, unspecified   . 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 Pure hypercholesterolemia   . Reflux esophagitis   . Screening for other eye conditions   . Type II or unspecified type diabetes mellitus without mention of complication, not stated as uncontrolled     Past Surgical History:  Procedure Laterality Date  . ABDOMINAL HYSTERECTOMY     2001  . CESAREAN SECTION     [id number]  . CHOLECYSTECTOMY     1986  . COLONOSCOPY    .  GASTRIC BYPASS     1986  . INSERTION OF MESH N/A 02/12/2016   Procedure: INSERTION OF MESH;  Surgeon: Donnie Mesa, MD;  Location: [facility];  Service: General;  Laterality: N/A;  . TUBAL LIGATION    . VENTRAL HERNIA REPAIR N/A 02/12/2016   Procedure: OPEN REPAIR OF VENTRAL INCISIONAL HERNIA;  Surgeon: Donnie Mesa, MD;  Location: [facility];  Service: General;  Laterality: N/A;    Current Meds  Medication Sig  . Acetaminophen (TYLENOL EXTRA STRENGTH PO) Take  650 mg by mouth 2 (two) times daily.  Marland Kitchen amLODipine (NORVASC) 10 MG tablet Take 10 mg by mouth daily.  Marland Kitchen aspirin EC 81 MG tablet Take 81 mg by mouth daily.  Marland Kitchen atorvastatin (LIPITOR) 80 MG tablet Take 80 mg by mouth at bedtime.   Marland Kitchen b complex vitamins tablet Take 1 tablet by mouth daily.  . carisoprodol (SOMA) 350 MG tablet Take 350 mg by mouth as needed.  . Cholecalciferol (VITAMIN D PO) Take 400 Units by mouth every morning.   . Ferrous Sulfate Dried (SLOW RELEASE IRON) 45 MG TBCR Take 45 mg by mouth daily.  . fluticasone (FLONASE) 50 MCG/ACT nasal spray Place 1 spray into both nostrils daily.   Marland Kitchen lisinopril (PRINIVIL,ZESTRIL) 10 MG tablet Take 10 mg by mouth daily.  Marland Kitchen loratadine (CLARITIN) 10 MG tablet Take 10 mg by mouth daily.  . metFORMIN (GLUCOPHAGE) 1000 MG tablet Take 1,000 mg by mouth 2 (two) times daily with a meal.  . montelukast (SINGULAIR) 10 MG tablet Take 10 mg by mouth at bedtime.  . Multiple Vitamin (MULTIVITAMIN) capsule Take 1 capsule by mouth daily.  Marland Kitchen omeprazole (PRILOSEC) 20 MG capsule Take 20 mg by mouth at bedtime.  . valACYclovir (VALTREX) 1000 MG tablet Take 1,000 mg by mouth 2 (two) times daily as needed (cold sores).     Allergies  Allergen Reactions  . Codeine Other (See Comments)    Hallucinations     Social History   Tobacco Use  . Smoking status: Never Smoker  . Smokeless tobacco: Never Used  Substance Use Topics  . Alcohol use: Yes    Comment: rare  . Drug use: No   Social History   Social History Narrative  . Not on file     family history includes CAD (age of onset: 67) in her mother; COPD in her mother; CVA in her father; Diabetes in her mother; Emphysema in her father.  Wt Readings from Last 3 Encounters:  10/14/17 175 lb (79.4 kg)  01/02/17 179 lb (81.2 kg)  12/09/16 179 lb (81.2 kg)    PHYSICAL EXAM BP 128/62   Pulse 68   Ht 4\' 11"  (1.499 m)   Wt 175 lb (79.4 kg)   BMI 35.35 kg/m  Physical Exam  Constitutional: She is  oriented to person, place, and time. She appears well-developed and well-nourished. No distress.  Well-groomed  HENT:  Head: Normocephalic and atraumatic.  Neck: Normal range of motion. Neck supple. No JVD present. Carotid bruit is not present. Thyromegaly: none.  Cardiovascular: Normal rate, regular rhythm, normal heart sounds and intact distal pulses.  No extrasystoles are present. PMI is not displaced. Exam reveals no gallop and no friction rub.  No murmur heard. Pulmonary/Chest: Effort normal and breath sounds normal. No respiratory distress. She has no wheezes. She has no rales. She exhibits no tenderness.  Abdominal: Soft. Bowel sounds are normal. She exhibits no distension. There is no tenderness.  Truncal obesity  Musculoskeletal: Normal  range of motion. She exhibits no edema.  Neurological: She is alert and oriented to person, place, and time.  Psychiatric: She has a normal mood and affect. Her behavior is normal. Judgment and thought content normal.  Nursing note and vitals reviewed.    Adult ECG Report  Rate: 68;  Rhythm: normal sinus rhythm and normal axis, intervals and durations;   Narrative Interpretation: normal EKG   Other studies Reviewed: Additional studies/ records that were reviewed today include:  Recent Labs: No labs available   ASSESSMENT / PLAN:  Overall doing better.  Symptoms seem to be improved.  On stable regimen.  Return as needed.  Problem List Items Addressed This Visit    Hyperlipidemia with target low density lipoprotein (LDL) cholesterol less than 100 mg/dL (Chronic)    On high-dose atorvastatin.  Labs should be followed by PCP.  I do not have them available.      Exercise intolerance     perhaps mild incontinence, but not enough to warrant therapy.  She says she is able to get her heart rate up now.  If there is persistent difficulty, would have to potentially consider reducing dose of amlodipine but for now we will make no changes.       Essential hypertension (Chronic)    Well-controlled on amlodipine and lisinopril.      Dyspnea on exertion - Primary    Low risk stress test.  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  Seems to be doing relatively well now, able to increase heart rate.  Monitor for worsening symptoms.  Defer to PCP.      Chest pain on exertion    She is really not having the sensation anymore.  She is able to do her routine walking without any issues. Negative Myoview stress test.  Not likely to be cardiac.         Current medicines are reviewed at length with the patient today. (+/- concerns) none The following changes have been made: none  Patient Instructions  Dr Ellyn Hack recommends that you follow-up with him as needed.   Studies Ordered:   No orders of the defined types were placed in this encounter.     Glenetta Hew, M.D., M.S. Interventional Cardiologist   Pager # [phone] Phone # [phone] [address] [place], [place] [address]

## 2017-10-17 ENCOUNTER — Encounter: Payer: Self-pay | Admitting: Cardiology

## 2017-10-17 NOTE — Assessment & Plan Note (Signed)
She is really not having the sensation anymore.  She is able to do her routine walking without any issues. Negative Myoview stress test.  Not likely to be cardiac.

## 2017-10-17 NOTE — Assessment & Plan Note (Signed)
perhaps mild incontinence, but not enough to warrant therapy.  She says she is able to get her heart rate up now.  If there is persistent difficulty, would have to potentially consider reducing dose of amlodipine but for now we will make no changes.

## 2017-10-17 NOTE — Assessment & Plan Note (Signed)
Low risk stress test.  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  Seems to be doing relatively well now, able to increase heart rate.  Monitor for worsening symptoms.  Defer to PCP.

## 2017-10-17 NOTE — Assessment & Plan Note (Signed)
On high-dose atorvastatin.  Labs should be followed by PCP.  I do not have them available.

## 2017-10-17 NOTE — Assessment & Plan Note (Addendum)
Well-controlled on amlodipine and lisinopril.

## 2017-11-11 ENCOUNTER — Ambulatory Visit: Payer: Medicare Other | Admitting: Physical Therapy

## 2017-11-18 ENCOUNTER — Ambulatory Visit: Payer: Medicare Other | Attending: Family Medicine | Admitting: Physical Therapy

## 2017-11-18 ENCOUNTER — Encounter: Payer: Self-pay | Admitting: Physical Therapy

## 2017-11-18 ENCOUNTER — Other Ambulatory Visit: Payer: Self-pay

## 2017-11-18 DIAGNOSIS — H8113 Benign paroxysmal vertigo, bilateral: Secondary | ICD-10-CM | POA: Diagnosis present

## 2017-11-18 DIAGNOSIS — R42 Dizziness and giddiness: Secondary | ICD-10-CM | POA: Insufficient documentation

## 2017-11-18 NOTE — Therapy (Signed)
[place], [place], [address] Phone: [phone]   Fax:  [phone]  Physical Therapy Evaluation  Patient Details  Name: Ashley Collier MRN: [id number] Date of Birth: 1951/02/14 Referring Provider: Coletta Memos   Encounter Date: 11/18/2017  PT End of Session - 11/18/17 1719    Visit Number  1    Date for PT Re-Evaluation  01/18/18    PT Start Time  7564    PT Stop Time  1533    PT Time Calculation (min)  48 min    Activity Tolerance  Patient tolerated treatment well    Behavior During Therapy  [facility] for tasks assessed/performed       Past Medical History:  Diagnosis Date  . Allergic rhinitis, cause unspecified   . Allergy   . Anemia   . Arthritis   . Body mass index 35.0-35.9, adult   . Essential hypertension, benign   . Family history of adverse reaction to anesthesia    nephew - difficulty waking up after surgery 5-6 years ago  . Obesity, unspecified   . 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 Pure hypercholesterolemia   . Reflux esophagitis   . Screening for other eye conditions   . Type II or unspecified type diabetes mellitus without mention of complication, not stated as uncontrolled     Past Surgical History:  Procedure Laterality Date  . ABDOMINAL HYSTERECTOMY     2001  . CESAREAN SECTION     [id number]  . CHOLECYSTECTOMY     1986  . COLONOSCOPY    . GASTRIC BYPASS     1986  . INSERTION OF MESH N/A 02/12/2016   Procedure: INSERTION OF MESH;  Surgeon: Donnie Mesa, MD;  Location: [facility];  Service: General;  Laterality: N/A;  . TUBAL LIGATION    . VENTRAL HERNIA REPAIR N/A 02/12/2016   Procedure: OPEN REPAIR OF VENTRAL INCISIONAL HERNIA;  Surgeon: Donnie Mesa, MD;  Location: [facility];  Service: General;  Laterality: N/A;    There were no vitals filed for this visit.   Subjective Assessment - 11/18/17 1443    Subjective   Patient reports that she has had some dizziness for a few months, she noticed first at [facility] when she turned quickly and other times again with quick turns while walking.      Limitations  Walking;House hold activities    Patient Stated Goals  be steady, not be dizzy    Currently in Pain?  No/denies         [facility] PT Assessment - 11/18/17 0001      Assessment   Medical Diagnosis  vertigo, dizzy    Referring Provider  [place]    Onset Date/Surgical Date  10/18/17    Prior Therapy  no      Precautions   Precautions  None      Balance Screen   Has the patient fallen in the past 6 months  No    Has the patient had a decrease in activity level because of a fear of falling?   No    Is the patient reluctant to leave their home because of a fear of falling?   No      Home Environment   Additional Comments  some housework      Prior Function   Level of Independence  Independent  Vocation  Retired    Leisure  was walking until about a year ago when she had some knee pain           Vestibular Assessment - 11/18/17 0001      Symptom Behavior   Type of Dizziness  "World moves"    Frequency of Dizziness  occasional    Duration of Dizziness  1 minute or less    Aggravating Factors  Turning head quickly    Relieving Factors  Head stationary      Occulomotor Exam   Occulomotor Alignment  Normal    Gaze-induced  Absent    Smooth Pursuits  Saccades    Saccades  Slow      Vestibulo-Occular Reflex   VOR 1 Head Only (x 1 viewing)  some dizziness    Comment  she is dizzy with head motions      Positional Testing   Dix-Hallpike  Dix-Hallpike Left;Dix-Hallpike Right   both negative         Objective measurements completed on examination: See above findings.              PT Education - 11/18/17 1717    Education Details  gave HEP for VOR exercises to include pursuits, saccades, and gaze stabilization    Person(s) Educated  Patient    Methods   Explanation;Demonstration;Handout    Comprehension  Verbalized understanding       PT Short Term Goals - 11/18/17 1734      PT SHORT TERM GOAL #1   Title  independent with initial HEP    Time  3    Period  Weeks    Status  New        PT Long Term Goals - 11/18/17 1735      PT LONG TERM GOAL #1   Title  report episodes of dizziness decreased >50%    Time  8    Period  Weeks    Status  New      PT LONG TERM GOAL #2   Title  report minimal to no issues while shopping    Time  8    Period  Weeks    Status  New      PT LONG TERM GOAL #3   Title  independent with an advanced HEP for balance    Time  8    Period  Weeks    Status  New             Plan - 11/18/17 1732    Clinical Presentation  Stable    Clinical Decision Making  Low    Rehab Potential  Good    PT Frequency  2x / week    PT Duration  8 weeks    PT Treatment/Interventions  ADLs/Self Care Home Management;Gait training;Neuromuscular re-education;Balance training;Therapeutic exercise;Therapeutic activities;Functional mobility training;Patient/family education;Vestibular;Visual/perceptual remediation/compensation    PT Next Visit Plan  assess HEP continue to add balance and visual activities, could try Dix-Halpike again    Consulted and Agree with Plan of Care  Patient       Patient will benefit from skilled therapeutic intervention in order to improve the following deficits and impairments:  Dizziness, Difficulty walking, Impaired vision/preception, Decreased balance  Visit Diagnosis: Dizziness and giddiness - Plan: PT plan of care cert/re-cert  BPPV (benign paroxysmal positional vertigo), bilateral - Plan: PT plan of care cert/re-cert     Problem List Patient Active Problem List   Diagnosis Date Noted  .  Hyperlipidemia with target low density lipoprotein (LDL) cholesterol less than 100 mg/dL 12/03/2016  . Essential hypertension 12/03/2016  . Dyspnea on exertion 12/02/2016  . Chest pain on  exertion 12/02/2016  . Exercise intolerance 12/02/2016  . DM type 2, controlled, with complication ([facility]) 50/38/8828  . Ventral incisional hernia 07/19/2013    Sumner Boast., PT 11/18/2017, 5:38 PM  [place], [place], [address] Phone: [phone]   Fax:  [phone]  Name: Ashley Collier MRN: [id number] Date of Birth: [birth year]

## 2017-11-25 ENCOUNTER — Encounter: Payer: Self-pay | Admitting: Physical Therapy

## 2017-11-25 ENCOUNTER — Ambulatory Visit: Payer: Medicare Other | Admitting: Physical Therapy

## 2017-11-25 DIAGNOSIS — R42 Dizziness and giddiness: Secondary | ICD-10-CM

## 2017-11-25 DIAGNOSIS — H8113 Benign paroxysmal vertigo, bilateral: Secondary | ICD-10-CM

## 2017-11-25 NOTE — Therapy (Signed)
Ashley Collier, [place], [address] Phone: [phone]   Fax:  [phone]  Physical Therapy Treatment  Patient Details  Name: Ashley Collier MRN: [id number] Date of Birth: 1950/11/20 Referring Provider: Coletta Memos   Encounter Date: 11/25/2017  PT End of Session - 11/25/17 1524    Visit Number  2    Date for PT Re-Evaluation  01/18/18    PT Start Time  7425    PT Stop Time  1525    PT Time Calculation (min)  46 min    Activity Tolerance  Patient tolerated treatment well    Behavior During Therapy  [facility] for tasks assessed/performed       Past Medical History:  Diagnosis Date  . Allergic rhinitis, cause unspecified   . Allergy   . Anemia   . Arthritis   . Body mass index 35.0-35.9, adult   . Essential hypertension, benign   . Family history of adverse reaction to anesthesia    nephew - difficulty waking up after surgery 5-6 years ago  . Obesity, unspecified   . 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 Pure hypercholesterolemia   . Reflux esophagitis   . Screening for other eye conditions   . Type II or unspecified type diabetes mellitus without mention of complication, not stated as uncontrolled     Past Surgical History:  Procedure Laterality Date  . ABDOMINAL HYSTERECTOMY     2001  . CESAREAN SECTION     [id number]  . CHOLECYSTECTOMY     1986  . COLONOSCOPY    . GASTRIC BYPASS     1986  . INSERTION OF MESH N/A 02/12/2016   Procedure: INSERTION OF MESH;  Surgeon: Donnie Mesa, MD;  Location: [facility];  Service: General;  Laterality: N/A;  . TUBAL LIGATION    . VENTRAL HERNIA REPAIR N/A 02/12/2016   Procedure: OPEN REPAIR OF VENTRAL INCISIONAL HERNIA;  Surgeon: Donnie Mesa, MD;  Location: [facility];  Service: General;  Laterality: N/A;    There were no vitals filed for this visit.  Subjective Assessment - 11/25/17 1440    Subjective   Reports some dizziness with the visuo-vestibular head and eyes moving same direction as target.  Dizziness lasting about 2-3 minutes    Currently in Pain?  No/denies                       [facility] Adult PT Treatment/Exercise - 11/25/17 0001      High Level Balance   High Level Balance Activities  Side stepping;Backward walking;Tandem walking;Direction changes;Head turns    High Level Balance Comments  ball toss, ball kicks, on airex head turns, ball toss, eyes closed. walking ball toss, chanign BOS             PT Education - 11/25/17 1523    Education Details  reviewed VOR, gave Brand-Daroff for future if she has dizziness with getting up, gave high level balance of firm, foam surface, BOS changes, eyes open/closed and head turns       PT Short Term Goals - 11/25/17 1527      PT SHORT TERM GOAL #1   Title  independent with initial HEP    Status  Achieved        PT Long Term Goals - 11/25/17 1527      PT LONG TERM  GOAL #1   Title  report episodes of dizziness decreased >50%    Status  Achieved      PT LONG TERM GOAL #2   Title  report minimal to no issues while shopping    Status  Achieved            Plan - 11/25/17 1525    Clinical Impression Statement  Overall patient feels like the VOR exercises helped her.  She performed well on the balance activities we did today and only had one instance of dizziness with high level activities.    PT Next Visit Plan  feel like the VOR exercises are what she needs, gave her some advanced balance activities, she is to try on own and call if needed    Consulted and Agree with Plan of Care  Patient       Patient will benefit from skilled therapeutic intervention in order to improve the following deficits and impairments:  Dizziness, Difficulty walking, Impaired vision/preception, Decreased balance  Visit Diagnosis: Dizziness and giddiness  BPPV (benign paroxysmal positional vertigo),  bilateral     Problem List Patient Active Problem List   Diagnosis Date Noted  . Hyperlipidemia with target low density lipoprotein (LDL) cholesterol less than 100 mg/dL 12/03/2016  . Essential hypertension 12/03/2016  . Dyspnea on exertion 12/02/2016  . Chest pain on exertion 12/02/2016  . Exercise intolerance 12/02/2016  . DM type 2, controlled, with complication ([place]) 74/16/3845  . Ventral incisional hernia 07/19/2013    Sumner Boast., PT 11/25/2017, 3:28 PM  [place] [address] [place], [place], [address] Phone: [phone]   Fax:  [phone]  Name: Ashley Collier MRN: [id number] Date of Birth: May 07, 1950

## 2017-12-03 ENCOUNTER — Encounter: Payer: Medicare Other | Admitting: Physical Therapy

## 2018-12-22 IMAGING — NM NM MISC PROCEDURE
3 series · 18 of 18 positions shown · non-contrast
Comparison: none

[Series 1: rest sax · 6.4mm · 6.40mm/px · 6 of 23 frames shown]
[frame 2/23]
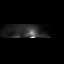
[frame 6/23]
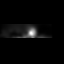
[frame 10/23]
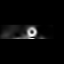
[frame 14/23]
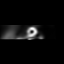
[frame 18/23]
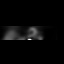
[frame 22/23]
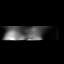

[Series 2: stress sax gs · 6.4mm · 6.40mm/px · 6 of 184 frames shown]
[frame 16/184]
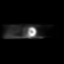
[frame 46/184]
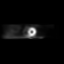
[frame 77/184]
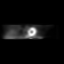
[frame 108/184]
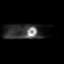
[frame 138/184]
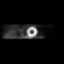
[frame 169/184]
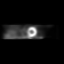

[Series 2: stress sax · 6.4mm · 6.40mm/px · 6 of 23 frames shown]
[frame 2/23]
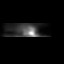
[frame 6/23]
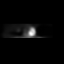
[frame 10/23]
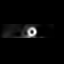
[frame 14/23]
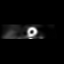
[frame 18/23]
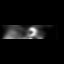
[frame 22/23]
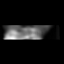

[18 of 18 positions shown; findings below may reference images not displayed]

Canned report from images found in remote index.

Refer to host system for actual result text.

## 2019-10-18 ENCOUNTER — Ambulatory Visit (INDEPENDENT_AMBULATORY_CARE_PROVIDER_SITE_OTHER): Payer: Medicare HMO

## 2019-10-18 ENCOUNTER — Ambulatory Visit
Admission: EM | Admit: 2019-10-18 | Discharge: 2019-10-18 | Disposition: A | Discharge status: Home / Self Care | Payer: Medicare HMO | Attending: Physician Assistant | Admitting: Physician Assistant

## 2019-10-18 ENCOUNTER — Other Ambulatory Visit: Payer: Self-pay

## 2019-10-18 DIAGNOSIS — R05 Cough: Secondary | ICD-10-CM | POA: Diagnosis not present

## 2019-10-18 DIAGNOSIS — R109 Unspecified abdominal pain: Secondary | ICD-10-CM | POA: Diagnosis present

## 2019-10-18 DIAGNOSIS — R509 Fever, unspecified: Secondary | ICD-10-CM

## 2019-10-18 DIAGNOSIS — R432 Parageusia: Secondary | ICD-10-CM | POA: Diagnosis present

## 2019-10-18 LAB — POCT URINALYSIS DIP (MANUAL ENTRY)
Bilirubin, UA: NEGATIVE
Blood, UA: NEGATIVE
Glucose, UA: NEGATIVE mg/dL
Ketones, POC UA: NEGATIVE mg/dL
Leukocytes, UA: NEGATIVE
Nitrite, UA: POSITIVE — AB
Protein Ur, POC: NEGATIVE mg/dL
Spec Grav, UA: 1.02 (ref 1.010–1.025)
Urobilinogen, UA: 0.2 E.U./dL
pH, UA: 8.5 — AB (ref 5.0–8.0)

## 2019-10-18 LAB — POC SARS CORONAVIRUS 2 AG -  ED: SARS Coronavirus 2 Ag: NEGATIVE

## 2019-10-18 MED ORDER — CEPHALEXIN 500 MG PO CAPS: 500.0000 mg | ORAL_CAPSULE | Freq: Three times a day (TID) | ORAL | 0 refills | Status: DC

## 2019-10-18 NOTE — Discharge Instructions (Addendum)
Chest x-ray negative.  Rapid Covid negative.  Covid PCR testing ordered, please quarantine until testing results return.  Urine shows some bacteria, start Keflex as directed. Keep hydrated, urine should be clear to pale yellow in color.  If having chest pain, shortness of breath, continued fever, weakness, dizziness, go to the emergency department for further evaluation.

## 2019-10-18 NOTE — ED Provider Notes (Signed)
[facility]    CSN: [id number] Arrival date & time: 10/18/19  1728      History   Chief Complaint Chief Complaint  Patient presents with   Fever   Flank Pain   Headache   Sore Throat   Cough    HPI Ashley Collier is a 69 y.o. female.   69 year old female with history of DM, HTN, HLD comes in for 1  day of URI symptoms. Rhinorrhea, nasal congestion, sinus pressure, dry cough, headache, loss of taste/smell.  Also complains of bilateral flank pain.  Fever with T-max of 103 temporally, responsive to antipyretic.  Denies abdominal pain, nausea, vomiting, diarrhea.  Denies shortness of breath, loss of taste or smell.  Fully COVID vaccinated.  OTC cold medicine without relief.     Past Medical History:  Diagnosis Date   Allergic rhinitis, cause unspecified    Allergy    Anemia    Arthritis    Body mass index 35.0-35.9, adult    Essential hypertension, benign    Family history of adverse reaction to anesthesia    nephew - difficulty waking up after surgery 5-6 years ago   Obesity, unspecified    Other genital herpes    was exposed at a young adult and takes Valtrex with out break occurs   Pure hypercholesterolemia    Reflux esophagitis    Screening for other eye conditions    Type II or unspecified type diabetes mellitus without mention of complication, not stated as uncontrolled     Patient Active Problem List   Diagnosis Date Noted   Hyperlipidemia with target low density lipoprotein (LDL) cholesterol less than 100 mg/dL 12/03/2016   Essential hypertension 12/03/2016   Dyspnea on exertion 12/02/2016   Chest pain on exertion 12/02/2016   Exercise intolerance 12/02/2016   DM type 2, controlled, with complication ([place]) 37/90/2409   Ventral incisional hernia 07/19/2013    Past Surgical History:  Procedure Laterality Date   ABDOMINAL HYSTERECTOMY     2001   CESAREAN SECTION     7353,29   CHOLECYSTECTOMY     1986    COLONOSCOPY     GASTRIC BYPASS     1986   INSERTION OF MESH N/A 02/12/2016   Procedure: INSERTION OF MESH;  Surgeon: Donnie Mesa, MD;  Location: [facility];  Service: General;  Laterality: N/A;   TUBAL LIGATION     VENTRAL HERNIA REPAIR N/A 02/12/2016   Procedure: OPEN REPAIR OF VENTRAL INCISIONAL HERNIA;  Surgeon: Donnie Mesa, MD;  Location: [facility];  Service: General;  Laterality: N/A;    OB History   No obstetric history on file.      Home Medications    Prior to Admission medications   Medication Sig Start Date End Date Taking? Authorizing Provider  Acetaminophen (TYLENOL EXTRA STRENGTH PO) Take 650 mg by mouth 2 (two) times daily.    [provider]  amLODipine (NORVASC) 10 MG tablet Take 10 mg by mouth daily.    [provider]  atorvastatin (LIPITOR) 80 MG tablet Take 80 mg by mouth at bedtime.     [provider]  b complex vitamins tablet Take 1 tablet by mouth daily.    [provider]  carisoprodol (SOMA) 350 MG tablet Take 350 mg by mouth as needed. 08/05/17   [provider]  cephALEXin (KEFLEX) 500 MG capsule Take 1 capsule (500 mg total) by mouth 3 (three) times daily. 10/18/19   Ok Edwards, PA-C  Cholecalciferol (VITAMIN D PO) Take 400 Units by mouth every morning.     [provider]  cyclobenzaprine (FLEXERIL) 10 MG tablet Take 10 mg by mouth at bedtime as needed for muscle spasms.  07/04/13   [provider]  fluticasone (FLONASE) 50 MCG/ACT nasal spray Place 1 spray into both nostrils daily.     [provider]  lisinopril (PRINIVIL,ZESTRIL) 10 MG tablet Take 10 mg by mouth daily.    [provider]  loratadine (CLARITIN) 10 MG tablet Take 10 mg by mouth daily.    [provider]  metFORMIN (GLUCOPHAGE) 1000 MG tablet Take 1,000 mg by mouth 2 (two) times daily with a meal.    [provider]  montelukast (SINGULAIR) 10 MG tablet Take 10 mg by mouth at bedtime.    [provider]  Multiple Vitamin (MULTIVITAMIN) capsule Take 1 capsule by mouth daily.    [provider]  omeprazole (PRILOSEC) 20 MG capsule Take 20 mg by mouth at bedtime.    [provider]  valACYclovir (VALTREX) 1000 MG tablet Take 1,000 mg by mouth 2 (two) times daily as needed (cold sores).     [provider]  Ferrous Sulfate Dried (SLOW RELEASE IRON) 45 MG TBCR Take 45 mg by mouth daily.  10/18/19  [provider]    Family History Family History  Problem Relation Age of Onset   Diabetes Mother    COPD Mother        long term smoker   CAD Mother 72       Dr. Einar Gip - Cath showed MV CAD;    CVA Father    Emphysema Father     Social History Social History   Tobacco Use   Smoking status: Never Smoker   Smokeless tobacco: Never Used  Vaping Use   Vaping Use: Never used  Substance Use Topics   Alcohol use: Yes    Comment: rare   Drug use: No     Allergies   Codeine   Review of Systems Review of Systems  Reason unable to perform ROS: See HPI as above.     Physical Exam Triage Vital Signs ED Triage Vitals  Enc Vitals Group     BP 10/18/19 1913 (!) 151/69     Pulse Rate 10/18/19 1913 89     Resp 10/18/19 1913 20     Temp 10/18/19 1913 99.1 F (37.3 C)     Temp Source 10/18/19 1913 Oral     SpO2 10/18/19 1913 98 %     Weight --      Height --      Head Circumference --      Peak Flow --      Pain Score 10/18/19 1917 3     Pain Loc --      Pain Edu? --      Excl. in [place]? --    No data found.  Updated Vital Signs BP (!) 151/69 (BP Location: Left Arm)    Pulse 89    Temp 99.1 F (37.3 C) (Oral)    Resp 20    SpO2 98%   Physical Exam Constitutional:      General: She is not in acute distress.    Appearance: Normal appearance. She is well-developed. She is not ill-appearing, toxic-appearing or diaphoretic.  HENT:     Head: Normocephalic and atraumatic.     Right Ear: Tympanic membrane, ear canal and  external ear normal. Tympanic  membrane is not erythematous or bulging.     Left Ear: Tympanic membrane, ear canal and external ear normal. Tympanic membrane is not erythematous or bulging.     Nose:     Right Sinus: No maxillary sinus tenderness or frontal sinus tenderness.     Left Sinus: No maxillary sinus tenderness or frontal sinus tenderness.     Mouth/Throat:     Mouth: Mucous membranes are moist.     Pharynx: Oropharynx is clear. Uvula midline.  Eyes:     Conjunctiva/sclera: Conjunctivae normal.     Pupils: Pupils are equal, round, and reactive to light.  Cardiovascular:     Rate and Rhythm: Normal rate and regular rhythm.  Pulmonary:     Effort: Pulmonary effort is normal. No accessory muscle usage, prolonged expiration, respiratory distress or retractions.     Breath sounds: No decreased air movement or transmitted upper airway sounds. No decreased breath sounds.     Comments: LCTAB Abdominal:     Palpations: Abdomen is soft.     Tenderness: There is no abdominal tenderness. There is no right CVA tenderness, left CVA tenderness, guarding or rebound.  Musculoskeletal:     Cervical back: Normal range of motion and neck supple.  Skin:    General: Skin is warm and dry.  Neurological:     Mental Status: She is alert and oriented to person, place, and time.      UC Treatments / Results  Labs (all labs ordered are listed, but only abnormal results are displayed) Labs Reviewed  POCT URINALYSIS DIP (MANUAL ENTRY) - Abnormal; Notable for the following components:      Result Value   pH, UA 8.5 (*)    Nitrite, UA Positive (*)    All other components within normal limits  URINE CULTURE  NOVEL CORONAVIRUS, NAA  POC SARS CORONAVIRUS 2 AG -  ED    EKG   Radiology DG Chest 2 View  Result Date: 10/18/2019 CLINICAL DATA:  Fever, cough. EXAM: CHEST - 2 VIEW COMPARISON:  May 26, 2007. FINDINGS: The heart size and mediastinal contours are within normal limits. Both lungs  are clear. The visualized skeletal structures are unremarkable. IMPRESSION: No active cardiopulmonary disease. Electronically Signed   By: Marijo Conception M.D.   On: 10/18/2019 20:01    Procedures Procedures (including critical care time)  Medications Ordered in UC Medications - No data to display  Initial Impression / Assessment and Plan / UC Course  I have reviewed the triage vital signs and the nursing notes.  Pertinent labs & imaging results that were available during my care of the patient were reviewed by me and considered in my medical decision making (see chart for details).    Patient currently afebrile, nontoxic.  Lungs clear to auscultation bilaterally without adventitious lung sounds.  However, given loss of taste and smell, flank pain, will obtain chest x-ray for further evaluation.  Rapid Covid testing as well.  Rapid covid negative. CXR negative. Urine with positive nitrites. Will send for urine culture, PCR COVID. Start keflex as directed. Strict return precautions given.   Final Clinical Impressions(s) / UC Diagnoses   Final diagnoses:  Fever, unspecified  Loss of taste  Bilateral flank pain    ED Prescriptions    Medication Sig Dispense Auth. Provider   cephALEXin (KEFLEX) 500 MG capsule Take 1 capsule (500 mg total) by mouth 3 (three) times daily. 21 capsule Ok Edwards, PA-C     PDMP not reviewed  this encounter.   Ok Edwards, PA-C 10/18/19 2205

## 2019-10-18 NOTE — ED Triage Notes (Signed)
Patient is here today with complaints of flank pain, sore throat(this afternoon), dry cough(today), headache(today), sinus pressure(this morning). 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 Patient states she has tried OTC Sinus medication with acetaminophen with some relief.

## 2019-10-20 LAB — NOVEL CORONAVIRUS, NAA: SARS-CoV-2, NAA: NOT DETECTED

## 2019-10-20 LAB — SARS-COV-2, NAA 2 DAY TAT

## 2019-10-22 LAB — URINE CULTURE: Culture: >=100000 — AB

## 2019-12-17 ENCOUNTER — Other Ambulatory Visit (HOSPITAL_COMMUNITY): Payer: Self-pay | Admitting: Oncology

## 2019-12-17 ENCOUNTER — Encounter: Payer: Self-pay | Admitting: Oncology

## 2019-12-17 DIAGNOSIS — U071 COVID-19: Secondary | ICD-10-CM

## 2019-12-17 NOTE — Progress Notes (Signed)
I connected by phone with  Ashley Collier  to discuss the potential use of an new treatment for mild to moderate COVID-19 viral infection in non-hospitalized patients.   This patient is a age/sex that meets the FDA criteria for Emergency Use Authorization of casirivimab\imdevimab.  Has a (+) direct SARS-CoV-2 viral test result 1. Has mild or moderate COVID-19  2. Is ? 68 years of age and weighs ? 40 kg 3. Is NOT hospitalized due to COVID-19 4. Is NOT requiring oxygen therapy or requiring an increase in baseline oxygen flow rate due to COVID-19 5. Is within 10 days of symptom onset 6. Has at least one of the high risk factor(s) for progression to severe COVID-19 and/or hospitalization as defined in EUA. ? Specific high risk criteria : Past Medical History:  Diagnosis Date  . Allergic rhinitis, cause unspecified   . Allergy   . Anemia   . Arthritis   . Body mass index 35.0-35.9, adult   . Essential hypertension, benign   . Family history of adverse reaction to anesthesia    nephew - difficulty waking up after surgery 5-6 years ago  . Obesity, unspecified   . 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 Pure hypercholesterolemia   . Reflux esophagitis   . Screening for other eye conditions   . Type II or unspecified type diabetes mellitus without mention of complication, not stated as uncontrolled   ?   Symptom onset  12/13/2019   I have spoken and communicated the following to the patient or parent/caregiver:   1. FDA has authorized the emergency use of casirivimab\imdevimab for the treatment of mild to moderate COVID-19 in adults and pediatric patients with positive results of direct SARS-CoV-2 viral testing who are 85 years of age and older weighing at least 40 kg, and who are at high risk for progressing to severe COVID-19 and/or hospitalization.   2. The significant known and potential risks and benefits of casirivimab\imdevimab,  and the extent to which such potential risks and benefits are unknown.   3. Information on available alternative treatments and the risks and benefits of those alternatives, including clinical trials.   4. Patients treated with casirivimab\imdevimab should continue to self-isolate and use infection control measures (e.g., wear mask, isolate, social distance, avoid sharing personal items, clean and disinfect "high touch" surfaces, and frequent handwashing) according to CDC guidelines.    5. The patient or parent/caregiver has the option to accept or refuse casirivimab\imdevimab .   After reviewing this information with the patient, The patient agreed to proceed with receiving casirivimab\imdevimab infusion and will be provided a copy of the Fact sheet prior to receiving the infusion.Rulon Abide, AGNP-C [phone] ([place])

## 2019-12-18 ENCOUNTER — Ambulatory Visit (HOSPITAL_COMMUNITY)
Admission: RE | Admit: 2019-12-18 | Discharge: 2019-12-18 | Disposition: A | Discharge status: Home / Self Care | Payer: Medicare Other | Source: Ambulatory Visit | Attending: Pulmonary Disease | Admitting: Pulmonary Disease

## 2019-12-18 DIAGNOSIS — Z23 Encounter for immunization: Secondary | ICD-10-CM | POA: Insufficient documentation

## 2019-12-18 DIAGNOSIS — U071 COVID-19: Secondary | ICD-10-CM | POA: Insufficient documentation

## 2019-12-18 MED ORDER — SODIUM CHLORIDE 0.9 % IV SOLN: INTRAVENOUS | Status: DC | PRN

## 2019-12-18 MED ORDER — METHYLPREDNISOLONE SODIUM SUCC 125 MG IJ SOLR: 125.0000 mg | Freq: Once | INTRAMUSCULAR | Status: DC | PRN

## 2019-12-18 MED ORDER — SODIUM CHLORIDE 0.9 % IV SOLN
1200.0000 mg | Freq: Once | INTRAVENOUS | Status: AC
  Administered 2019-12-18: 1200 mg via INTRAVENOUS

## 2019-12-18 MED ORDER — EPINEPHRINE 0.3 MG/0.3ML IJ SOAJ: 0.3000 mg | Freq: Once | INTRAMUSCULAR | Status: DC | PRN

## 2019-12-18 MED ORDER — DIPHENHYDRAMINE HCL 50 MG/ML IJ SOLN: 50.0000 mg | Freq: Once | INTRAMUSCULAR | Status: DC | PRN

## 2019-12-18 MED ORDER — FAMOTIDINE IN NACL 20-0.9 MG/50ML-% IV SOLN: 20.0000 mg | Freq: Once | INTRAVENOUS | Status: DC | PRN

## 2019-12-18 MED ORDER — ALBUTEROL SULFATE HFA 108 (90 BASE) MCG/ACT IN AERS: 2.0000 | INHALATION_SPRAY | Freq: Once | RESPIRATORY_TRACT | Status: DC | PRN

## 2019-12-18 NOTE — Progress Notes (Signed)
°  Diagnosis: COVID-19  Physician: Dr. Joya Gaskins   Procedure: [facility] Med: casirivimab\imdevimab infusion - Provided patient with casirivimab\imdevimab fact sheet for patients, parents and caregivers prior to infusion.  Complications: No immediate complications noted.  Discharge: Discharged home   Claudia Desanctis 12/18/2019

## 2019-12-18 NOTE — Discharge Instructions (Signed)

## 2019-12-19 ENCOUNTER — Other Ambulatory Visit (HOSPITAL_COMMUNITY): Payer: Self-pay

## 2021-07-29 ENCOUNTER — Other Ambulatory Visit: Payer: Self-pay | Admitting: Dermatology

## 2021-07-30 LAB — DERMPATH, SPECIMEN A

## 2021-07-30 LAB — DERMATOPATHOLOGY REPORT

## 2021-10-30 IMAGING — DX DG CHEST 2V
2 series · 2 of 2 positions shown · non-contrast
Comparison: May 26, 2007.

CLINICAL DATA: Fever, cough.

EXAM:
CHEST - 2 VIEW

[chest pa]
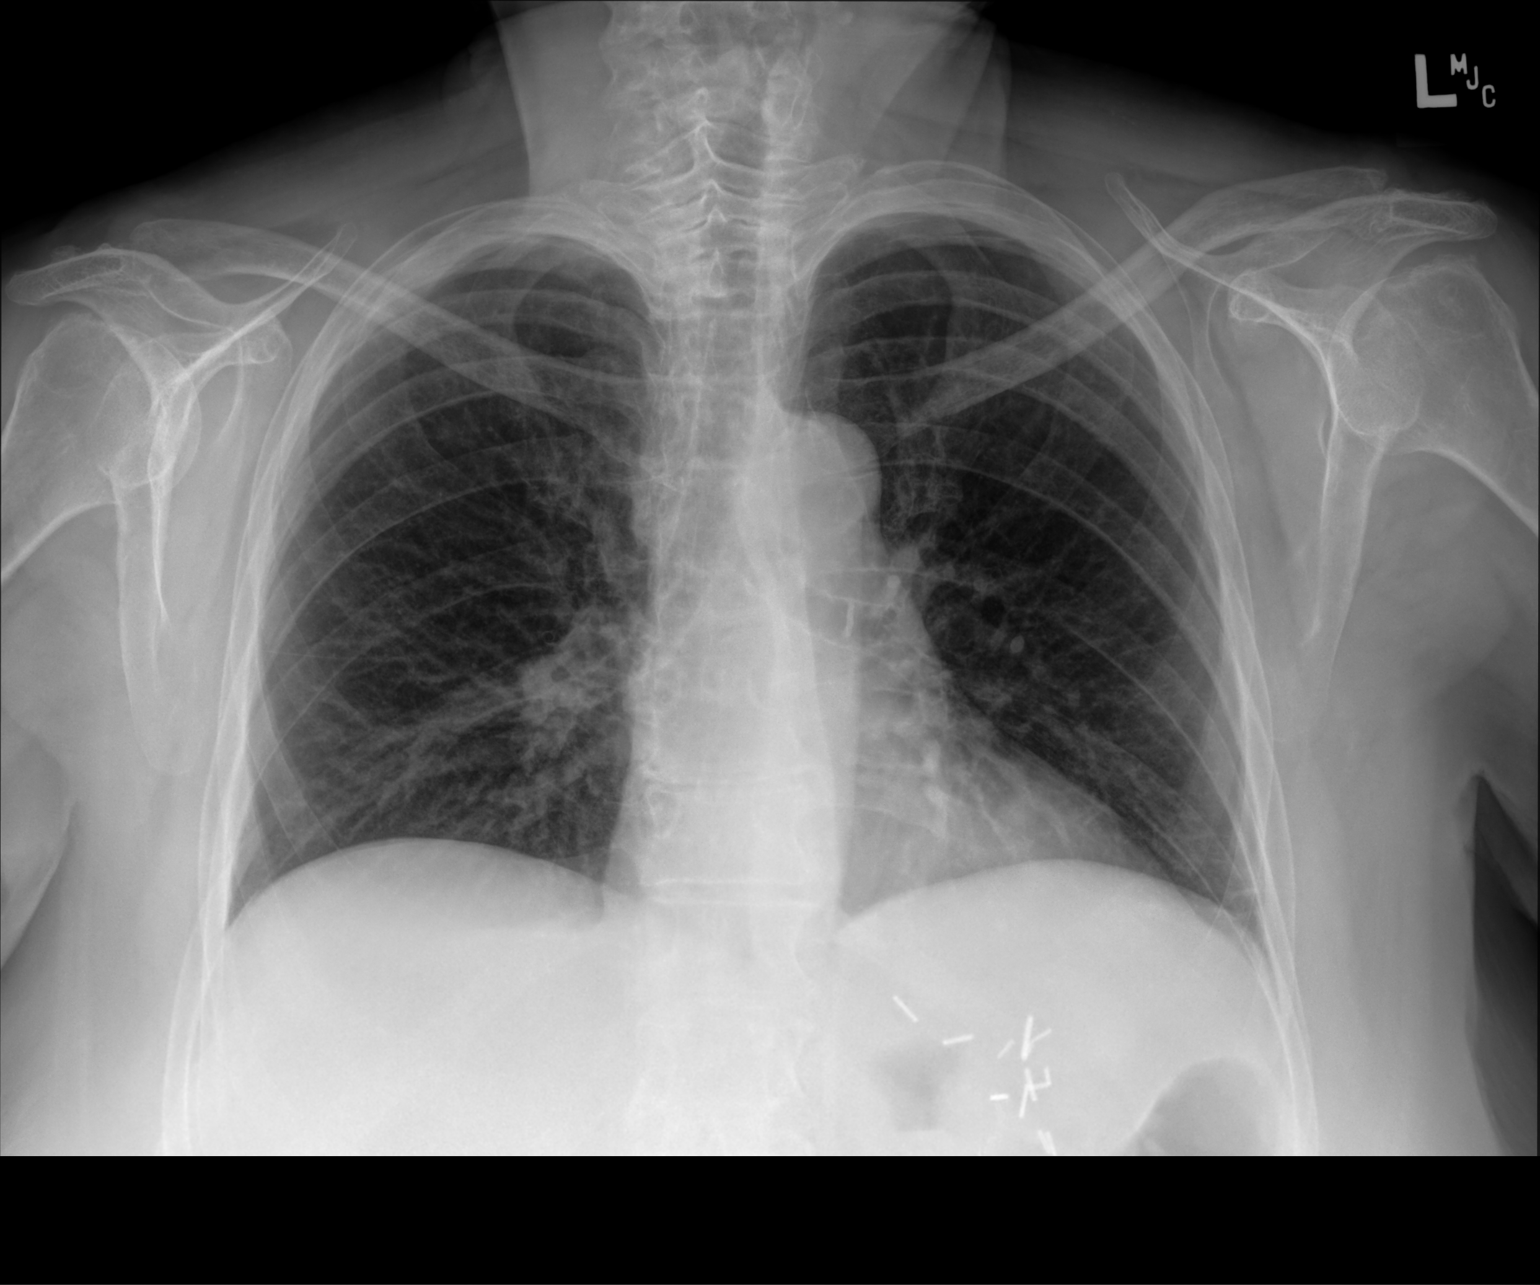

[chest lat]
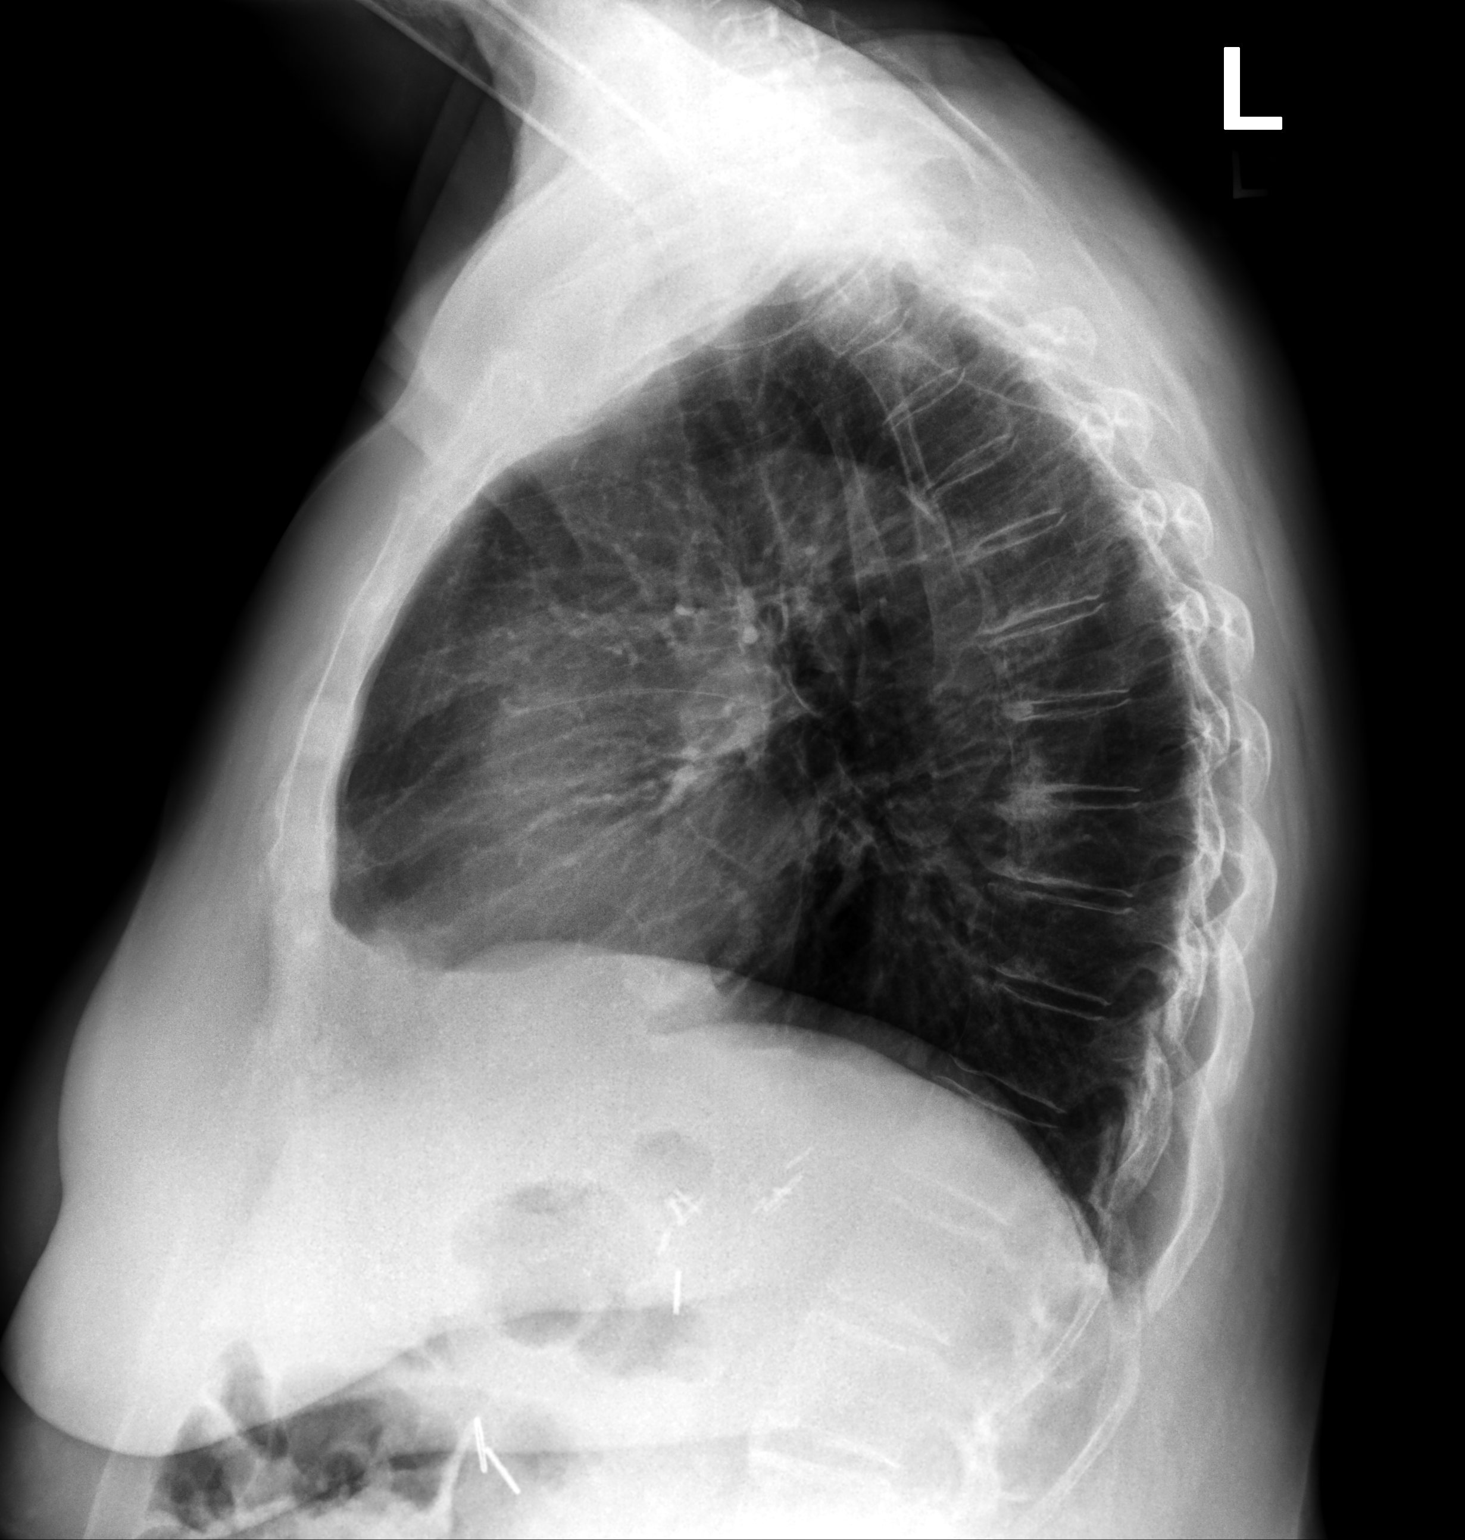

[2 of 2 positions shown; findings below may reference images not displayed]

FINDINGS: The heart size and mediastinal contours are within normal limits.
Both lungs are clear. The visualized skeletal structures are
unremarkable.
IMPRESSION: No active cardiopulmonary disease.

## 2023-02-16 ENCOUNTER — Emergency Department (HOSPITAL_COMMUNITY)
Admission: EM | Admit: 2023-02-16 | Discharge: 2023-02-16 | Disposition: A | Discharge status: Home / Self Care | Payer: Medicare HMO | Attending: Emergency Medicine | Admitting: Emergency Medicine

## 2023-02-16 ENCOUNTER — Other Ambulatory Visit: Payer: Self-pay

## 2023-02-16 ENCOUNTER — Encounter (HOSPITAL_COMMUNITY): Payer: Self-pay | Admitting: Emergency Medicine

## 2023-02-16 DIAGNOSIS — Z711 Person with feared health complaint in whom no diagnosis is made: Secondary | ICD-10-CM

## 2023-02-16 DIAGNOSIS — M25551 Pain in right hip: Secondary | ICD-10-CM | POA: Insufficient documentation

## 2023-02-16 DIAGNOSIS — M25511 Pain in right shoulder: Secondary | ICD-10-CM | POA: Diagnosis present

## 2023-02-16 DIAGNOSIS — Y92410 Unspecified street and highway as the place of occurrence of the external cause: Secondary | ICD-10-CM | POA: Insufficient documentation
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
## 2023-02-16 NOTE — Discharge Instructions (Signed)
Return to the emergency department immediately if you develop any of the following symptoms: °You have numbness, tingling, or weakness in the arms or legs. °You develop severe headaches not relieved with medicine. °You have severe neck pain, especially tenderness in the middle of the back of your neck. °You have changes in bowel or bladder control. °There is increasing pain in any area of the body. °You have shortness of breath, light-headedness, dizziness, or fainting. °You have chest pain. °You feel sick to your stomach (nauseous), throw up (vomit), or sweat. °You have increasing abdominal discomfort. °There is blood in your urine, stool, or vomit. °You have pain in your shoulder (shoulder strap areas). °You feel your symptoms are getting worse. ° °
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
## 2023-07-08 ENCOUNTER — Telehealth (HOSPITAL_COMMUNITY): Payer: Self-pay | Admitting: *Deleted

## 2023-07-08 NOTE — Telephone Encounter (Signed)
 Received referral notification from Dyane Dustman  at [facility] for this pt to participate in Cardiac rehab s/p drug eluting stent placement.  Called pt who is very interested in participating but concerned about the cost.  Discussed options such as 2 x week for shorter period of time. Noted that pt will have a staged procedure.  Will hold off on scheduling cardiac rehab until the second procedure is completed.  This is scheduled for 4/28 as same day.  Previously scheduled cardiology follow up  with Dr. Houston Siren for 5/2 that may be pushed out a bit. Advised pt that I would check back in with her after 4/28.  Pt verbalized understanding and is very excited. Alanson Aly, BSN Cardiac and Emergency planning/management officer

## 2023-08-03 ENCOUNTER — Telehealth (HOSPITAL_COMMUNITY): Payer: Self-pay | Admitting: *Deleted

## 2023-08-03 NOTE — Telephone Encounter (Signed)
 Called and left message for pt. Regarding staged procedure for PCI at [facility].  Spoke with pt last month after her first PCI and did not see any notes for the procedure which was tentatively scheduled for 5/2.  Asked for return call for clarification. Lettie Ray, BSN Cardiac and Pulmonary Rehab Nurse Navigator '

## 2023-08-19 ENCOUNTER — Telehealth (HOSPITAL_COMMUNITY): Payer: Self-pay

## 2023-08-19 NOTE — Telephone Encounter (Signed)
 Attempted to call patient in regards to Cardiac Rehab - LM on VM

## 2023-08-28 ENCOUNTER — Telehealth (HOSPITAL_COMMUNITY): Payer: Self-pay

## 2023-08-28 NOTE — Telephone Encounter (Signed)
 Attempted to return pt phone call, LMTCB.

## 2023-09-03 ENCOUNTER — Telehealth (HOSPITAL_COMMUNITY): Payer: Self-pay

## 2023-09-03 NOTE — Telephone Encounter (Signed)
 Attempted to return pt phone call in regards to CR. Voicemail box full, unable to leave VM.

## 2023-09-08 ENCOUNTER — Telehealth (HOSPITAL_COMMUNITY): Payer: Self-pay

## 2023-09-08 ENCOUNTER — Encounter (HOSPITAL_COMMUNITY): Payer: Self-pay

## 2023-09-08 NOTE — Telephone Encounter (Signed)
 Pt returned CR phone call and stated she is interested in CR. She would like to do TCR only Monday and Wednesdays due to her co-pay. Patient will come in for orientation on 09/15/23 @ 8AM and will attend the 8:15AM exercise class.   Pensions consultant.

## 2023-09-08 NOTE — Telephone Encounter (Signed)
 Attempted to call patient in regards to Cardiac Rehab - unable to leave VM, voicemail box full.   Mailed letter

## 2023-09-08 NOTE — Telephone Encounter (Signed)
 Pt insurance is active and benefits verified through [facility]. Co-pay (G-codes $55.00) (9-Codes $40.00), DED $0.00/$0.00 met, out of pocket $4,150.00/$3,009.00 met, co-insurance 0%. No pre-authorization required. Maeve T./[employer], 09/08/23 @ 3:01PM, ZOX#[id number]   How many CR sessions are covered? (36 visits for TCR, 72 visits for ICR)72 Is this a lifetime maximum or an annual maximum? Annual Has the member used any of these services to date? No Is there a time limit (weeks/months) on start of program and/or program completion? No

## 2023-09-14 ENCOUNTER — Telehealth (HOSPITAL_COMMUNITY): Payer: Self-pay

## 2023-09-14 ENCOUNTER — Other Ambulatory Visit (HOSPITAL_COMMUNITY): Payer: Self-pay

## 2023-09-14 NOTE — Telephone Encounter (Signed)
 Confirmed appt for Cardiac Rehab and completed health assessment/history with pt via phone.

## 2023-09-15 ENCOUNTER — Ambulatory Visit: Payer: Self-pay | Admitting: Cardiology

## 2023-09-15 ENCOUNTER — Encounter (HOSPITAL_COMMUNITY)
Admission: RE | Admit: 2023-09-15 | Discharge: 2023-09-15 | Disposition: A | Discharge status: Home / Self Care | Source: Ambulatory Visit | Attending: Cardiology | Admitting: Cardiology

## 2023-09-15 VITALS — BP 152/62 | HR 60 | Ht 59.5 in | Wt 136.2 lb

## 2023-09-15 DIAGNOSIS — Z955 Presence of coronary angioplasty implant and graft: Secondary | ICD-10-CM | POA: Diagnosis present

## 2023-09-15 LAB — GLUCOSE, CAPILLARY: Glucose-Capillary: 105 mg/dL — ABNORMAL HIGH (ref 70–99)

## 2023-09-15 NOTE — Progress Notes (Signed)
 Cardiac Rehab Medication Review   Does the patient  feel that his/her medications are working for him/her?  yes  Has the patient been experiencing any side effects to the medications prescribed?  no  Does the patient measure his/her own blood pressure or blood glucose at home?  yes   Does the patient have any problems obtaining medications due to transportation or finances?   no  Understanding of regimen: excellent Understanding of indications: excellent Potential of compliance: excellent    Comments: Holds metoprolol if heart rate less than 60. Takes lasix every three days.    Ashley Collier 09/15/2023 9:43 AM

## 2023-09-15 NOTE — Progress Notes (Signed)
 Ashley Collier in today for cardiac rehab orientation. Called During the 6 minute walk test at the 4:35 mark, Ashley Collier complained of feeling short of breat, dizzy and lightheaded. BP 162/70 and 2 minute post 142/62.  Normal sinus rhythm. Drank water. Reviewed what she had for breakfast which was a half a slice of bread with peanut butter and a rolled up [place] meat.  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 Checked bp standing 130/64.  In talking with Ashley Collier, pt feels this way on exertion such as walking from the store/church to her car (note she does not have a handicapped/disabled parking permit).  Pt remarks that she is very deconditioned  and any energy exerted tends to wipe me out since Thanksgiving. Exercise for about 5 minutes with walking in her home because of the heat.  Post walk she has to sit down drink and eat a snack.  Often she will take a nap after this and repeat the walk later in the day. Reviewed medications.  Pt had her medications this morning within the parameters to hold depending upon HR and BP.  Metoprolol has been held for the several days as her resting HR Hovers right below 60.  Lasix is listed as prn however she takes Lasix every 3 days. Ashley Collier took Lasix on yesterday.  Weighs herself every morning and records in her notebook. Per her home scales she was 132 yesterday and today 134.  Asked what she uses a measurement to know when to take it she stated I just take it every three days.  Asked had she received any education on Heart Failure.  Indicated she had been given a book.  The book indicated she should call her provider for weight gain of 1-2 pounds overnight.  Ashley Collier kept calling the office and was told to contact if she had a sudden/trending up weight gain of 5 pounds.  Asked she ever called she states no.  Asked had she noticed that the days or the day after she takes her Lasix she feels drained or more fatigued than usual.  Hadn't made that correlation but will be mindful of this.  Plan to contact Dr. Candiss Chamorro office to advise of todays events as well as her complaints she has had a home.  Upon standing to leave, felt lightheaded sat down had some more water.  After a couple of minutes, felt better.  Went to bathroom - no complaint.  Ashley Collier Him to her car, felt able to drive  declined offered wheel chair. Explained the use of Lasix and potential effects of this medication. Ashley Collier ambulated with no complaint and finished her half of sandwich of bread and [place].  To call to let us  know she arrived home.   At 1154 Spoke with [facility] Dr Candiss Chamorro nurse.  Will inform DrAaron Aas Gilles Lacks and follow up with the pt later today.   Ashley Collier, BSN Cardiac and Emergency planning/management officer

## 2023-09-15 NOTE — Progress Notes (Signed)
 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 Take with a full glass of water on an empty stomach., Disp: , Rfl:    aspirin EC 81 MG tablet, Take 81 mg by mouth daily. Swallow whole., Disp: , Rfl:    atorvastatin (LIPITOR) 80 MG tablet, Take 40 mg by mouth at bedtime., Disp: , Rfl:    b complex vitamins tablet, Take 1 tablet by mouth daily., Disp: , Rfl:    calcium carbonate (CALCIUM 600) 600 MG TABS tablet, Take 600 mg by mouth 2 (two) times daily with a meal., Disp: , Rfl:    clopidogrel (PLAVIX) 75 MG tablet, Take 75 mg by mouth daily., Disp: , Rfl:    cyanocobalamin (VITAMIN B12) 250 MCG tablet, Take 250 mcg by mouth daily., Disp: , Rfl:    ferrous gluconate (FERGON) 324 MG tablet, Take 324 mg by mouth daily with breakfast., Disp: , Rfl:    fluticasone (FLONASE) 50 MCG/ACT nasal spray, Place 1 spray into both nostrils daily. , Disp: , Rfl:    furosemide (LASIX) 20 MG tablet, Take 20 mg by mouth every 3 (three) days., Disp: , Rfl:    lisinopril (PRINIVIL,ZESTRIL) 10 MG tablet, Take 5 mg by mouth daily., Disp: , Rfl:     loratadine (CLARITIN) 10 MG tablet, Take 10 mg by mouth daily., Disp: , Rfl:    metFORMIN (GLUCOPHAGE) 1000 MG tablet, Take 500 mg by mouth 2 (two) times daily with a meal., Disp: , Rfl:    metoprolol succinate (TOPROL-XL) 25 MG 24 hr tablet, Take 25 mg by mouth daily. Hold if SBP<100 or HR<60, Disp: , Rfl:    montelukast (SINGULAIR) 10 MG tablet, Take 10 mg by mouth at bedtime., Disp: , Rfl:    Multiple Vitamin (MULTIVITAMIN) capsule, Take 1 capsule by mouth daily., Disp: , Rfl:    pantoprazole (PROTONIX) 40 MG tablet, Take 40 mg by mouth daily., Disp: , Rfl:    spironolactone (ALDACTONE) 25 MG tablet, Take 12.5 mg by mouth daily. Hold if SBP<110, Disp: , Rfl:    traZODone (DESYREL) 50 MG tablet, Take 50 mg by mouth at bedtime. Take 1/2-1 tablet nightly prn, Disp: , Rfl:    valACYclovir (VALTREX) 1000 MG tablet, Take 1,000 mg by mouth 2 (two) times daily as needed (cold sores). 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 with out break occurs   Pure hypercholesterolemia    Reflux esophagitis    Screening for other eye conditions    Type II or unspecified type diabetes mellitus without mention of complication, not stated as uncontrolled     Tobacco Use: Social History   Tobacco Use  Smoking Status Never  Smokeless Tobacco Never    Labs: Review Flowsheet       Latest Ref Rng & Units 02/08/2016  Labs for ITP Cardiac and Pulmonary Rehab  Hemoglobin A1c 4.8 - 5.6 % 6.5     Capillary Blood Glucose: Lab Results  Component Value Date   GLUCAP 105 (H) 09/15/2023   GLUCAP 146 (H) 02/12/2016    GLUCAP 112 (H) 02/12/2016   GLUCAP 99 02/08/2016   GLUCAP 106 (H) 03/16/2014     Exercise Target Goals: Exercise Program Goal: Individual exercise prescription set using results from initial 6 min walk test and THRR while considering  patient's activity barriers and safety.   Exercise Prescription Goal: Initial exercise prescription builds to 30-45 minutes a day of aerobic activity, 2-3 days per week.  Home exercise guidelines will be given to patient during program as part of exercise prescription that the participant will acknowledge.  Activity Barriers & Risk Stratification:  Activity Barriers & Cardiac Risk Stratification - 09/15/23 0939       Activity Barriers & Cardiac Risk Stratification   Activity Barriers Arthritis;Back Problems;History of Falls;Shortness of Breath;Deconditioning    Cardiac Risk Stratification High          6 Minute Walk:  6 Minute Walk     Row Name 09/15/23 1014         6 Minute Walk   Phase Initial     Distance 691 feet     Walk Time 4.58 minutes     # of Rest Breaks 1  Seated rest at 4 mins 35 seconds til end of test.     MPH 1.71     METS 1.62     RPE 15     Perceived Dyspnea  2     VO2 Peak 5.66     Symptoms Yes (comment)     Comments Shortness of breath, dizzy, lightheaded.     Resting HR 60 bpm     Resting BP 152/62     Resting Oxygen Saturation  99 %     Exercise Oxygen Saturation  during 6 min walk 100 %     Max Ex. HR 78 bpm     Max Ex. BP 162/70     2 Minute Post BP 142/62        Oxygen Initial Assessment:   Oxygen Re-Evaluation:   Oxygen Discharge (Final Oxygen Re-Evaluation):   Initial Exercise Prescription:  Initial Exercise Prescription - 09/15/23 1300       Date of Initial Exercise RX and Referring Provider   Date 09/15/23    Referring Provider Georges Kings, MD ([facility])   Jacqueline Matsu, MD (covering)   Expected Discharge Date 11/16/23      NuStep   Level 1    SPM 75    Minutes 30    METs 1.6       Prescription Details   Frequency (times per week) 2    Duration  Progress to 30 minutes of continuous aerobic without signs/symptoms of physical distress      Intensity   THRR 40-80% of Max Heartrate 60-119    Ratings of Perceived Exertion 11-13    Perceived Dyspnea 0-4      Progression   Progression Continue to progress workloads to maintain intensity without signs/symptoms of physical distress.      Resistance Training   Training Prescription Yes    Weight 2 lbs    Reps 10-15          Perform Capillary Blood Glucose checks as needed.  Exercise Prescription Changes:   Exercise Comments:   Exercise Goals and Review:   Exercise Goals     Row Name 09/15/23 0940             Exercise Goals   Increase Physical Activity Yes       Intervention Provide advice, education, support and counseling about physical activity/exercise needs.;Develop an individualized exercise prescription for aerobic and resistive training based on initial evaluation findings, risk stratification, comorbidities and participant's personal goals.       Expected Outcomes Short Term: Attend rehab on a regular basis to increase amount of physical activity.;Long Term: Exercising regularly at least 3-5 days a week.;Long Term: Add in home exercise to make exercise part of routine and to increase amount of physical activity.       Increase Strength and Stamina Yes       Intervention Provide advice, education, support and counseling about physical activity/exercise needs.;Develop an individualized exercise prescription for aerobic and resistive training based on initial evaluation findings, risk stratification, comorbidities and participant's personal goals.       Expected Outcomes Short Term: Increase workloads from initial exercise prescription for resistance, speed, and METs.;Short Term: Perform resistance training exercises routinely during rehab and add in resistance training at home;Long Term: Improve  cardiorespiratory fitness, muscular endurance and strength as measured by increased METs and functional capacity ( )       Able to understand and use rate of perceived exertion (RPE) scale Yes       Intervention Provide education and explanation on how to use RPE scale       Expected Outcomes Short Term: Able to use RPE daily in rehab to express subjective intensity level;Long Term:  Able to use RPE to guide intensity level when exercising independently       Knowledge and understanding of Target Heart Rate Range (THRR) Yes       Intervention Provide education and explanation of THRR including how the numbers were predicted and where they are located for reference       Expected Outcomes Short Term: Able to state/look up THRR;Long Term: Able to use THRR to govern intensity when exercising independently;Short Term: Able to use daily as guideline for intensity in rehab       Understanding of Exercise Prescription Yes       Intervention Provide education, explanation, and written materials on patient's individual exercise prescription       Expected Outcomes Short Term: Able to explain program exercise prescription;Long Term: Able to explain home exercise prescription to exercise independently          Exercise Goals Re-Evaluation :   Discharge Exercise Prescription (Final Exercise Prescription Changes):   Nutrition:  Target Goals: Understanding of nutrition guidelines, daily intake of sodium 1500mg , cholesterol 200mg , calories 30% from fat and 7% or less from saturated fats, daily to have 5 or more servings of fruits and  vegetables.  Biometrics:  Pre Biometrics - 09/15/23 0820       Pre Biometrics   Waist Circumference 39.5 inches    Hip Circumference 42 inches    Waist to Hip Ratio 0.94 %    Triceps Skinfold 19 mm    % Body Fat 39.3 %    Grip Strength 8 kg    Flexibility 12.13 in    Single Leg Stand 11.87 seconds           Nutrition Therapy Plan and Nutrition  Goals:   Nutrition Assessments:  MEDIFICTS Score Key: >=70 Need to make dietary changes  40-70 Heart Healthy Diet <= 40 Therapeutic Level Cholesterol Diet    Picture Your Plate Scores: <09 Unhealthy dietary pattern with much room for improvement. 41-50 Dietary pattern unlikely to meet recommendations for good health and room for improvement. 51-60 More healthful dietary pattern, with some room for improvement.  >60 Healthy dietary pattern, although there may be some specific behaviors that could be improved.    Nutrition Goals Re-Evaluation:   Nutrition Goals Re-Evaluation:   Nutrition Goals Discharge (Final Nutrition Goals Re-Evaluation):   Psychosocial: Target Goals: Acknowledge presence or absence of significant depression and/or stress, maximize coping skills, provide positive support system. Participant is able to verbalize types and ability to use techniques and skills needed for reducing stress and depression.  Initial Review & Psychosocial Screening:  Initial Psych Review & Screening - 09/15/23 0949       Initial Review   Current issues with Current Stress Concerns    Source of Stress Concerns Financial    Comments Does breathing exercises / calming exercises if stress level is elevated.      Family Dynamics   Good Support System? Yes    Comments Neilah has support from her husband and sister who she lives with.      Barriers   Psychosocial barriers to participate in program The patient should benefit from training in stress management and relaxation.;Psychosocial barriers identified (see note)      Screening Interventions   Interventions Encouraged to exercise;To provide support and resources with identified psychosocial needs;Provide feedback about the scores to participant    Expected Outcomes Short Term goal: Utilizing psychosocial counselor, staff and physician to assist with identification of specific Stressors or current issues interfering with healing  process. Setting desired goal for each stressor or current issue identified.;Long Term Goal: Stressors or current issues are controlled or eliminated.;Short Term goal: Identification and review with participant of any Quality of Life or Depression concerns found by scoring the questionnaire.;Long Term goal: The participant improves quality of Life and PHQ9 Scores as seen by post scores and/or verbalization of changes          Quality of Life Scores:  Quality of Life - 09/15/23 1314       Quality of Life   Select Quality of Life      Quality of Life Scores   Health/Function Pre 21.93 %    Socioeconomic Pre 29.64 %    Psych/Spiritual Pre 26.57 %    Family Pre 30 %    GLOBAL Pre 25.66 %         Scores of 19 and below usually indicate a poorer quality of life in these areas.  A difference of  2-3 points is a clinically meaningful difference.  A difference of 2-3 points in the total score of the Quality of Life Index has been associated with significant improvement in overall quality of  life, self-image, physical symptoms, and general health in studies assessing change in quality of life.  PHQ-9: Review Flowsheet       09/15/2023  Depression screen PHQ 2/9  Decreased Interest 0  Down, Depressed, Hopeless 0  PHQ - 2 Score 0  Altered sleeping 1  Tired, decreased energy 1  Change in appetite 1  Feeling bad or failure about yourself  0  Trouble concentrating 1  Moving slowly or fidgety/restless 1  Suicidal thoughts 0  PHQ-9 Score 5  Difficult doing work/chores Somewhat difficult   Interpretation of Total Score  Total Score Depression Severity:  1-4 = Minimal depression, 5-9 = Mild depression, 10-14 = Moderate depression, 15-19 = Moderately severe depression, 20-27 = Severe depression   Psychosocial Evaluation and Intervention:   Psychosocial Re-Evaluation:   Psychosocial Discharge (Final Psychosocial Re-Evaluation):   Vocational Rehabilitation: Provide vocational rehab  assistance to qualifying candidates.   Vocational Rehab Evaluation & Intervention:  Vocational Rehab - 09/15/23 0981       Initial Vocational Rehab Evaluation & Intervention   Assessment shows need for Vocational Rehabilitation No      Vocational Rehab Re-Evaulation   Comments Trula is retired and has no VR needs at this time.          Education: Education Goals: Education classes will be provided on a weekly basis, covering required topics. Participant will state understanding/return demonstration of topics presented.     Core Videos: Exercise    Move It!  Clinical staff conducted group or individual video education with verbal and written material and guidebook.  Patient learns the recommended Pritikin exercise program. Exercise with the goal of living a long, healthy life. Some of the health benefits of exercise include controlled diabetes, healthier blood pressure levels, improved cholesterol levels, improved heart and lung capacity, improved sleep, and better body composition. Everyone should speak with their doctor before starting or changing an exercise routine.  Biomechanical Limitations Clinical staff conducted group or individual video education with verbal and written material and guidebook.  Patient learns how biomechanical limitations can impact exercise and how we can mitigate and possibly overcome limitations to have an impactful and balanced exercise routine.  Body Composition Clinical staff conducted group or individual video education with verbal and written material and guidebook.  Patient learns that body composition (ratio of muscle mass to fat mass) is a key component to assessing overall fitness, rather than body weight alone. Increased fat mass, especially visceral belly fat, can put us  at increased risk for metabolic syndrome, type 2 diabetes, heart disease, and even death. It is recommended to combine diet and exercise (cardiovascular and resistance training)  to improve your body composition. Seek guidance from your physician and exercise physiologist before implementing an exercise routine.  Exercise Action Plan Clinical staff conducted group or individual video education with verbal and written material and guidebook.  Patient learns the recommended strategies to achieve and enjoy long-term exercise adherence, including variety, self-motivation, self-efficacy, and positive decision making. Benefits of exercise include fitness, good health, weight management, more energy, better sleep, less stress, and overall well-being.  Medical   Heart Disease Risk Reduction Clinical staff conducted group or individual video education with verbal and written material and guidebook.  Patient learns our heart is our most vital organ as it circulates oxygen, nutrients, white blood cells, and hormones throughout the entire body, and carries waste away. Data supports a plant-based eating plan like the Pritikin Program for its effectiveness in slowing progression of and  reversing heart disease. The video provides a number of recommendations to address heart disease.   Metabolic Syndrome and Belly Fat  Clinical staff conducted group or individual video education with verbal and written material and guidebook.  Patient learns what metabolic syndrome is, how it leads to heart disease, and how one can reverse it and keep it from coming back. You have metabolic syndrome if you have 3 of the following 5 criteria: abdominal obesity, high blood pressure, high triglycerides, low HDL cholesterol, and high blood sugar.  Hypertension and Heart Disease Clinical staff conducted group or individual video education with verbal and written material and guidebook.  Patient learns that high blood pressure, or hypertension, is very common in the [place] . Hypertension is largely due to excessive salt intake, but other important risk factors include being overweight, physical inactivity,  drinking too much alcohol, smoking, and not eating enough potassium from fruits and vegetables. High blood pressure is a leading risk factor for heart attack, stroke, congestive heart failure, dementia, kidney failure, and premature death. Long-term effects of excessive salt intake include stiffening of the arteries and thickening of heart muscle and organ damage. Recommendations include ways to reduce hypertension and the risk of heart disease.  Diseases of Our Time - Focusing on Diabetes Clinical staff conducted group or individual video education with verbal and written material and guidebook.  Patient learns why the best way to stop diseases of our time is prevention, through food and other lifestyle changes. Medicine (such as prescription pills and surgeries) is often only a Band-Aid on the problem, not a long-term solution. Most common diseases of our time include obesity, type 2 diabetes, hypertension, heart disease, and cancer. The Pritikin Program is recommended and has been proven to help reduce, reverse, and/or prevent the damaging effects of metabolic syndrome.  Nutrition   Overview of the Pritikin Eating Plan  Clinical staff conducted group or individual video education with verbal and written material and guidebook.  Patient learns about the Pritikin Eating Plan for disease risk reduction. The Pritikin Eating Plan emphasizes a wide variety of unrefined, minimally-processed carbohydrates, like fruits, vegetables, whole grains, and legumes. Go, Caution, and Stop food choices are explained. Plant-based and lean animal proteins are emphasized. Rationale provided for low sodium intake for blood pressure control, low added sugars for blood sugar stabilization, and low added fats and oils for coronary artery disease risk reduction and weight management.  Calorie Density  Clinical staff conducted group or individual video education with verbal and written material and guidebook.  Patient learns  about calorie density and how it impacts the Pritikin Eating Plan. Knowing the characteristics of the food you choose will help you decide whether those foods will lead to weight gain or weight loss, and whether you want to consume more or less of them. Weight loss is usually a side effect of the Pritikin Eating Plan because of its focus on low calorie-dense foods.  Label Reading  Clinical staff conducted group or individual video education with verbal and written material and guidebook.  Patient learns about the Pritikin recommended label reading guidelines and corresponding recommendations regarding calorie density, added sugars, sodium content, and whole grains.  Dining Out - Part 1  Clinical staff conducted group or individual video education with verbal and written material and guidebook.  Patient learns that restaurant meals can be sabotaging because they can be so high in calories, fat, sodium, and/or sugar. Patient learns recommended strategies on how to positively address this and avoid  unhealthy pitfalls.  Facts on Fats  Clinical staff conducted group or individual video education with verbal and written material and guidebook.  Patient learns that lifestyle modifications can be just as effective, if not more so, as many medications for lowering your risk of heart disease. A Pritikin lifestyle can help to reduce your risk of inflammation and atherosclerosis (cholesterol build-up, or plaque, in the artery walls). Lifestyle interventions such as dietary choices and physical activity address the cause of atherosclerosis. A review of the types of fats and their impact on blood cholesterol levels, along with dietary recommendations to reduce fat intake is also included.  Nutrition Action Plan  Clinical staff conducted group or individual video education with verbal and written material and guidebook.  Patient learns how to incorporate Pritikin recommendations into their lifestyle.  Recommendations include planning and keeping personal health goals in mind as an important part of their success.  Healthy Mind-Set    Healthy Minds, Bodies, Hearts  Clinical staff conducted group or individual video education with verbal and written material and guidebook.  Patient learns how to identify when they are stressed. Video will discuss the impact of that stress, as well as the many benefits of stress management. Patient will also be introduced to stress management techniques. The way we think, act, and feel has an impact on our hearts.  How Our Thoughts Can Heal Our Hearts  Clinical staff conducted group or individual video education with verbal and written material and guidebook.  Patient learns that negative thoughts can cause depression and anxiety. This can result in negative lifestyle behavior and serious health problems. Cognitive behavioral therapy is an effective method to help control our thoughts in order to change and improve our emotional outlook.  Additional Videos:  Exercise    Improving Performance  Clinical staff conducted group or individual video education with verbal and written material and guidebook.  Patient learns to use a non-linear approach by alternating intensity levels and lengths of time spent exercising to help burn more calories and lose more body fat. Cardiovascular exercise helps improve heart health, metabolism, hormonal balance, blood sugar control, and recovery from fatigue. Resistance training improves strength, endurance, balance, coordination, reaction time, metabolism, and muscle mass. Flexibility exercise improves circulation, posture, and balance. Seek guidance from your physician and exercise physiologist before implementing an exercise routine and learn your capabilities and proper form for all exercise.  Introduction to Yoga  Clinical staff conducted group or individual video education with verbal and written material and guidebook.   Patient learns about yoga, a discipline of the coming together of mind, breath, and body. The benefits of yoga include improved flexibility, improved range of motion, better posture and core strength, increased lung function, weight loss, and positive self-image. Yoga's heart health benefits include lowered blood pressure, healthier heart rate, decreased cholesterol and triglyceride levels, improved immune function, and reduced stress. Seek guidance from your physician and exercise physiologist before implementing an exercise routine and learn your capabilities and proper form for all exercise.  Medical   Aging: Enhancing Your Quality of Life  Clinical staff conducted group or individual video education with verbal and written material and guidebook.  Patient learns key strategies and recommendations to stay in good physical health and enhance quality of life, such as prevention strategies, having an advocate, securing a Health Care Proxy and Power of Attorney, and keeping a list of medications and system for tracking them. It also discusses how to avoid risk for bone loss.  Biology  of Weight Control  Clinical staff conducted group or individual video education with verbal and written material and guidebook.  Patient learns that weight gain occurs because we consume more calories than we burn (eating more, moving less). Even if your body weight is normal, you may have higher ratios of fat compared to muscle mass. Too much body fat puts you at increased risk for cardiovascular disease, heart attack, stroke, type 2 diabetes, and obesity-related cancers. In addition to exercise, following the Pritikin Eating Plan can help reduce your risk.  Decoding Lab Results  Clinical staff conducted group or individual video education with verbal and written material and guidebook.  Patient learns that lab test reflects one measurement whose values change over time and are influenced by many factors, including  medication, stress, sleep, exercise, food, hydration, pre-existing medical conditions, and more. It is recommended to use the knowledge from this video to become more involved with your lab results and evaluate your numbers to speak with your doctor.   Diseases of Our Time - Overview  Clinical staff conducted group or individual video education with verbal and written material and guidebook.  Patient learns that according to the CDC, 50% to 70% of chronic diseases (such as obesity, type 2 diabetes, elevated lipids, hypertension, and heart disease) are avoidable through lifestyle improvements including healthier food choices, listening to satiety cues, and increased physical activity.  Sleep Disorders Clinical staff conducted group or individual video education with verbal and written material and guidebook.  Patient learns how good quality and duration of sleep are important to overall health and well-being. Patient also learns about sleep disorders and how they impact health along with recommendations to address them, including discussing with a physician.  Nutrition  Dining Out - Part 2 Clinical staff conducted group or individual video education with verbal and written material and guidebook.  Patient learns how to plan ahead and communicate in order to maximize their dining experience in a healthy and nutritious manner. Included are recommended food choices based on the type of restaurant the patient is visiting.   Fueling a Banker conducted group or individual video education with verbal and written material and guidebook.  There is a strong connection between our food choices and our health. Diseases like obesity and type 2 diabetes are very prevalent and are in large-part due to lifestyle choices. The Pritikin Eating Plan provides plenty of food and hunger-curbing satisfaction. It is easy to follow, affordable, and helps reduce health risks.  Menu Workshop  Clinical  staff conducted group or individual video education with verbal and written material and guidebook.  Patient learns that restaurant meals can sabotage health goals because they are often packed with calories, fat, sodium, and sugar. Recommendations include strategies to plan ahead and to communicate with the manager, chef, or server to help order a healthier meal.  Planning Your Eating Strategy  Clinical staff conducted group or individual video education with verbal and written material and guidebook.  Patient learns about the Pritikin Eating Plan and its benefit of reducing the risk of disease. The Pritikin Eating Plan does not focus on calories. Instead, it emphasizes high-quality, nutrient-rich foods. By knowing the characteristics of the foods, we choose, we can determine their calorie density and make informed decisions.  Targeting Your Nutrition Priorities  Clinical staff conducted group or individual video education with verbal and written material and guidebook.  Patient learns that lifestyle habits have a tremendous impact on disease risk and progression.  This video provides eating and physical activity recommendations based on your personal health goals, such as reducing LDL cholesterol, losing weight, preventing or controlling type 2 diabetes, and reducing high blood pressure.  Vitamins and Minerals  Clinical staff conducted group or individual video education with verbal and written material and guidebook.  Patient learns different ways to obtain key vitamins and minerals, including through a recommended healthy diet. It is important to discuss all supplements you take with your doctor.   Healthy Mind-Set    Smoking Cessation  Clinical staff conducted group or individual video education with verbal and written material and guidebook.  Patient learns that cigarette smoking and tobacco addiction pose a serious health risk which affects millions of people. Stopping smoking will  significantly reduce the risk of heart disease, lung disease, and many forms of cancer. Recommended strategies for quitting are covered, including working with your doctor to develop a successful plan.  Culinary   Becoming a Set designer conducted group or individual video education with verbal and written material and guidebook.  Patient learns that cooking at home can be healthy, cost-effective, quick, and puts them in control. Keys to cooking healthy recipes will include looking at your recipe, assessing your equipment needs, planning ahead, making it simple, choosing cost-effective seasonal ingredients, and limiting the use of added fats, salts, and sugars.  Cooking - Breakfast and Snacks  Clinical staff conducted group or individual video education with verbal and written material and guidebook.  Patient learns how important breakfast is to satiety and nutrition through the entire day. Recommendations include key foods to eat during breakfast to help stabilize blood sugar levels and to prevent overeating at meals later in the day. Planning ahead is also a key component.  Cooking - Educational psychologist conducted group or individual video education with verbal and written material and guidebook.  Patient learns eating strategies to improve overall health, including an approach to cook more at home. Recommendations include thinking of animal protein as a side on your plate rather than center stage and focusing instead on lower calorie dense options like vegetables, fruits, whole grains, and plant-based proteins, such as beans. Making sauces in large quantities to freeze for later and leaving the skin on your vegetables are also recommended to maximize your experience.  Cooking - Healthy Salads and Dressing Clinical staff conducted group or individual video education with verbal and written material and guidebook.  Patient learns that vegetables, fruits, whole grains,  and legumes are the foundations of the Pritikin Eating Plan. Recommendations include how to incorporate each of these in flavorful and healthy salads, and how to create homemade salad dressings. Proper handling of ingredients is also covered. Cooking - Soups and [employer] - Soups and Desserts Clinical staff conducted group or individual video education with verbal and written material and guidebook.  Patient learns that Pritikin soups and desserts make for easy, nutritious, and delicious snacks and meal components that are low in sodium, fat, sugar, and calorie density, while high in vitamins, minerals, and filling fiber. Recommendations include simple and healthy ideas for soups and desserts.   Overview     The Pritikin Solution Program Overview Clinical staff conducted group or individual video education with verbal and written material and guidebook.  Patient learns that the results of the Pritikin Program have been documented in more than 100 articles published in peer-reviewed journals, and the benefits include reducing risk factors for (and, in some cases,  even reversing) high cholesterol, high blood pressure, type 2 diabetes, obesity, and more! An overview of the three key pillars of the Pritikin Program will be covered: eating well, doing regular exercise, and having a healthy mind-set.  WORKSHOPS  Exercise: Exercise Basics: Building Your Action Plan Clinical staff led group instruction and group discussion with PowerPoint presentation and patient guidebook. To enhance the learning environment the use of posters, models and videos may be added. At the conclusion of this workshop, patients will comprehend the difference between physical activity and exercise, as well as the benefits of incorporating both, into their routine. Patients will understand the FITT (Frequency, Intensity, Time, and Type) principle and how to use it to build an exercise action plan. In addition, safety  concerns and other considerations for exercise and cardiac rehab will be addressed by the presenter. The purpose of this lesson is to promote a comprehensive and effective weekly exercise routine in order to improve patients' overall level of fitness.   Managing Heart Disease: Your Path to a Healthier Heart Clinical staff led group instruction and group discussion with PowerPoint presentation and patient guidebook. To enhance the learning environment the use of posters, models and videos may be added.At the conclusion of this workshop, patients will understand the anatomy and physiology of the heart. Additionally, they will understand how Pritikin's three pillars impact the risk factors, the progression, and the management of heart disease.  The purpose of this lesson is to provide a high-level overview of the heart, heart disease, and how the Pritikin lifestyle positively impacts risk factors.  Exercise Biomechanics Clinical staff led group instruction and group discussion with PowerPoint presentation and patient guidebook. To enhance the learning environment the use of posters, models and videos may be added. Patients will learn how the structural parts of their bodies function and how these functions impact their daily activities, movement, and exercise. Patients will learn how to promote a neutral spine, learn how to manage pain, and identify ways to improve their physical movement in order to promote healthy living. The purpose of this lesson is to expose patients to common physical limitations that impact physical activity. Participants will learn practical ways to adapt and manage aches and pains, and to minimize their effect on regular exercise. Patients will learn how to maintain good posture while sitting, walking, and lifting.  Balance Training and Fall Prevention  Clinical staff led group instruction and group discussion with PowerPoint presentation and patient guidebook. To  enhance the learning environment the use of posters, models and videos may be added. At the conclusion of this workshop, patients will understand the importance of their sensorimotor skills (vision, proprioception, and the vestibular system) in maintaining their ability to balance as they age. Patients will apply a variety of balancing exercises that are appropriate for their current level of function. Patients will understand the common causes for poor balance, possible solutions to these problems, and ways to modify their physical environment in order to minimize their fall risk. The purpose of this lesson is to teach patients about the importance of maintaining balance as they age and ways to minimize their risk of falling.  WORKSHOPS   Nutrition:  Fueling a Ship broker led group instruction and group discussion with PowerPoint presentation and patient guidebook. To enhance the learning environment the use of posters, models and videos may be added. Patients will review the foundational principles of the Pritikin Eating Plan and understand what constitutes a serving size in each of the  food groups. Patients will also learn Pritikin-friendly foods that are better choices when away from home and review make-ahead meal and snack options. Calorie density will be reviewed and applied to three nutrition priorities: weight maintenance, weight loss, and weight gain. The purpose of this lesson is to reinforce (in a group setting) the key concepts around what patients are recommended to eat and how to apply these guidelines when away from home by planning and selecting Pritikin-friendly options. Patients will understand how calorie density may be adjusted for different weight management goals.  Mindful Eating  Clinical staff led group instruction and group discussion with PowerPoint presentation and patient guidebook. To enhance the learning environment the use of posters, models and videos may  be added. Patients will briefly review the concepts of the Pritikin Eating Plan and the importance of low-calorie dense foods. The concept of mindful eating will be introduced as well as the importance of paying attention to internal hunger signals. Triggers for non-hunger eating and techniques for dealing with triggers will be explored. The purpose of this lesson is to provide patients with the opportunity to review the basic principles of the Pritikin Eating Plan, discuss the value of eating mindfully and how to measure internal cues of hunger and fullness using the Hunger Scale. Patients will also discuss reasons for non-hunger eating and learn strategies to use for controlling emotional eating.  Targeting Your Nutrition Priorities Clinical staff led group instruction and group discussion with PowerPoint presentation and patient guidebook. To enhance the learning environment the use of posters, models and videos may be added. Patients will learn how to determine their genetic susceptibility to disease by reviewing their family history. Patients will gain insight into the importance of diet as part of an overall healthy lifestyle in mitigating the impact of genetics and other environmental insults. The purpose of this lesson is to provide patients with the opportunity to assess their personal nutrition priorities by looking at their family history, their own health history and current risk factors. Patients will also be able to discuss ways of prioritizing and modifying the Pritikin Eating Plan for their highest risk areas  Menu  Clinical staff led group instruction and group discussion with PowerPoint presentation and patient guidebook. To enhance the learning environment the use of posters, models and videos may be added. Using menus brought in from [employer], or printed from [employer], patients will apply the Pritikin dining out guidelines that were presented in the [employer] video. Patients will also be able to practice these guidelines in a variety of provided scenarios. The purpose of this lesson is to provide patients with the opportunity to practice hands-on learning of the Pritikin Dining Out guidelines with actual menus and practice scenarios.  Label Reading Clinical staff led group instruction and group discussion with PowerPoint presentation and patient guidebook. To enhance the learning environment the use of posters, models and videos may be added. Patients will review and discuss the Pritikin label reading guidelines presented in Pritikin's Label Reading Educational series video. Using fool labels brought in from local grocery stores and markets, patients will apply the label reading guidelines and determine if the packaged food meet the Pritikin guidelines. The purpose of this lesson is to provide patients with the opportunity to review, discuss, and practice hands-on learning of the Pritikin Label Reading guidelines with actual packaged food labels. Cooking School  Pritikin's [employer] are designed to teach patients ways to prepare quick, simple, and affordable recipes  at home. The importance of nutrition's role in chronic disease risk reduction is reflected in its emphasis in the overall Pritikin program. By learning how to prepare essential core Pritikin Eating Plan recipes, patients will increase control over what they eat; be able to customize the flavor of foods without the use of added salt, sugar, or fat; and improve the quality of the food they consume. By learning a set of core recipes which are easily assembled, quickly prepared, and affordable, patients are more likely to prepare more healthy foods at home. These workshops focus on convenient breakfasts, simple entres, side dishes, and desserts which can be prepared with minimal effort and are consistent with nutrition recommendations for cardiovascular risk reduction. Cooking  [employer] are taught by a Armed forces logistics/support/administrative officer (RD) who has been trained by the [employer]. The chef or RD has a clear understanding of the importance of minimizing - if not completely eliminating - added fat, sugar, and sodium in recipes. Throughout the series of Cooking School Workshop sessions, patients will learn about healthy ingredients and efficient methods of cooking to build confidence in their capability to prepare    Cooking School weekly topics:  Adding Flavor- Sodium-Free  Fast and Healthy Breakfasts  Powerhouse Plant-Based Proteins  Satisfying Salads and Dressings  Simple Sides and Sauces  International Cuisine-Spotlight on the [employer] Zones  Delicious Desserts  Savory Soups  [employer] - Meals in a Astronomer Appetizers and Snacks  Comforting Weekend Breakfasts  One-Pot Wonders   Fast Evening Meals  Landscape architect Your Pritikin Plate  WORKSHOPS   Healthy Mindset (Psychosocial):  Focused Goals, Sustainable Changes Clinical staff led group instruction and group discussion with PowerPoint presentation and patient guidebook. To enhance the learning environment the use of posters, models and videos may be added. Patients will be able to apply effective goal setting strategies to establish at least one personal goal, and then take consistent, meaningful action toward that goal. They will learn to identify common barriers to achieving personal goals and develop strategies to overcome them. Patients will also gain an understanding of how our mind-set can impact our ability to achieve goals and the importance of cultivating a positive and growth-oriented mind-set. The purpose of this lesson is to provide patients with a deeper understanding of how to set and achieve personal goals, as well as the tools and strategies needed to overcome common obstacles which may arise along the way.  From Head to Heart: The Power of a Healthy  Outlook  Clinical staff led group instruction and group discussion with PowerPoint presentation and patient guidebook. To enhance the learning environment the use of posters, models and videos may be added. Patients will be able to recognize and describe the impact of emotions and mood on physical health. They will discover the importance of self-care and explore self-care practices which may work for them. Patients will also learn how to utilize the 4 C's to cultivate a healthier outlook and better manage stress and challenges. The purpose of this lesson is to demonstrate to patients how a healthy outlook is an essential part of maintaining good health, especially as they continue their cardiac rehab journey.  Healthy Sleep for a Healthy Heart Clinical staff led group instruction and group discussion with PowerPoint presentation and patient guidebook. To enhance the learning environment the use of posters, models and videos may be added. At the conclusion of this workshop, patients will be able to demonstrate knowledge of  the importance of sleep to overall health, well-being, and quality of life. They will understand the symptoms of, and treatments for, common sleep disorders. Patients will also be able to identify daytime and nighttime behaviors which impact sleep, and they will be able to apply these tools to help manage sleep-related challenges. The purpose of this lesson is to provide patients with a general overview of sleep and outline the importance of quality sleep. Patients will learn about a few of the most common sleep disorders. Patients will also be introduced to the concept of "sleep hygiene," and discover ways to self-manage certain sleeping problems through simple daily behavior changes. Finally, the workshop will motivate patients by clarifying the links between quality sleep and their goals of heart-healthy living.   Recognizing and Reducing Stress Clinical staff led group instruction and  group discussion with PowerPoint presentation and patient guidebook. To enhance the learning environment the use of posters, models and videos may be added. At the conclusion of this workshop, patients will be able to understand the types of stress reactions, differentiate between acute and chronic stress, and recognize the impact that chronic stress has on their health. They will also be able to apply different coping mechanisms, such as reframing negative self-talk. Patients will have the opportunity to practice a variety of stress management techniques, such as deep abdominal breathing, progressive muscle relaxation, and/or guided imagery.  The purpose of this lesson is to educate patients on the role of stress in their lives and to provide healthy techniques for coping with it.  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   Learning Preferences Video          Education Topics:  Knowledge Questionnaire Score:  Knowledge Questionnaire Score - 09/15/23 1315       Knowledge Questionnaire Score   Pre Score 19/24          Core Components/Risk Factors/Patient Goals at Admission:  Personal Goals and Risk Factors at Admission - 09/15/23 0940       Core Components/Risk Factors/Patient Goals on Admission   Hypertension Yes    Intervention Provide education on lifestyle modifcations including regular physical activity/exercise, weight management, moderate sodium restriction and increased consumption of fresh fruit, vegetables, and low fat dairy, alcohol moderation, and smoking cessation.;Monitor prescription use compliance.    Expected Outcomes Short Term: Continued assessment and intervention until BP is < 140/53mm HG in hypertensive participants. < 130/37mm HG in hypertensive participants with diabetes, heart failure or chronic kidney  disease.;Long Term: Maintenance of blood pressure at goal levels.    Stress Yes    Intervention Offer individual and/or small group education and counseling on adjustment to heart disease, stress management and health-related lifestyle change. Teach and support self-help strategies.;Refer participants experiencing significant psychosocial distress to appropriate mental health specialists for further evaluation and treatment. When possible, include family members and significant others in education/counseling sessions.    Expected Outcomes Short Term: Participant demonstrates changes in health-related behavior, relaxation and other stress management skills, ability to obtain effective social support, and compliance with psychotropic medications if prescribed.;Long Term: Emotional wellbeing is indicated by absence of clinically significant psychosocial distress or social isolation.          Core Components/Risk Factors/Patient Goals Review:    Core Components/Risk Factors/Patient Goals at Discharge (Final Review):    ITP Comments:  ITP Comments  Row Name 09/15/23 0820           ITP Comments Medical Director- Dr. Gaylyn Keas, MD. Introduction to the Cardiac Rehab Program. Review of intital orientation folder.          Comments: Roshni attended orientation for the cardiac rehabilitation program on  09/15/2023  to perform initial intake and exercise walk test. Patient introduced to the Pritikin Program education and orientation packet was reviewed. Completed 6-minute walk test, measurements, initial ITP, and exercise prescription. Vital signs elevated blood pressure at rest. She hasn't taken her metoprolol the past few days d/t heart rate less than 60. Telemetry-normal sinus rhythm, isolated PVC. Tenesia c/o feeling lightheaded, dizzy, and short of breath 4.58 minutes into the 6-minutes walk test. Vital signs taken and patient assessed by Adora Aland, RN, Nurse Navigator, and Dr.  Maybell Spates office contacted. Hydration encouraged. Cullen was asymptomatic at exit.  Service time was from 820 to 1136. Doree Games, MS, ACSM CEP 09/15/2023 1320

## 2023-09-21 ENCOUNTER — Encounter (HOSPITAL_COMMUNITY)
Admission: RE | Admit: 2023-09-21 | Discharge: 2023-09-21 | Disposition: A | Discharge status: Home / Self Care | Source: Ambulatory Visit | Attending: Cardiology | Admitting: Cardiology

## 2023-09-21 ENCOUNTER — Ambulatory Visit: Payer: Self-pay | Admitting: Cardiology

## 2023-09-21 DIAGNOSIS — Z955 Presence of coronary angioplasty implant and graft: Secondary | ICD-10-CM

## 2023-09-21 LAB — GLUCOSE, CAPILLARY
Glucose-Capillary: 100 mg/dL — ABNORMAL HIGH (ref 70–99)
Glucose-Capillary: 102 mg/dL — ABNORMAL HIGH (ref 70–99)

## 2023-09-21 NOTE — Progress Notes (Signed)
 Daily Session Note  Patient Details  Name: Ashley Collier MRN: [id number] Date of Birth: 12/23/1950 Referring Provider:   Flowsheet Row CARDIAC REHAB PHASE II ORIENTATION from 09/15/2023 in [facility]  Referring Provider Fredrica Lipoma, MD [place])  Leldon Wilbert SAUNDERS, MD (covering)]    Encounter Date: 09/21/2023  Check In:  Session Check In - 09/21/23 0827       Check-In   Supervising physician immediately available to respond to emergencies [facility] MD immediately available    Physician(s) Josefa Beauvais, NP    Location [facility]    Staff Present Arnoldo Gal, MS, ACSM-CEP, Exercise Physiologist;Enis Leatherwood, RN, Mallory Parkins, MS, ACSM-CEP, CCRP, Exercise Physiologist;Johnny Fayette, MS, Exercise Physiologist;Jetta Walker BS, ACSM-CEP, Exercise Physiologist    Virtual Visit No    Medication changes reported     No    Fall or balance concerns reported    No    Tobacco Cessation No Change    Warm-up and Cool-down Performed as group-led instruction    Resistance Training Performed No    VAD Patient? No    PAD/SET Patient? No      Pain Assessment   Currently in Pain? No/denies    Pain Score 0-No pain    Multiple Pain Sites No          Capillary Blood Glucose: Results for orders placed or performed during the hospital encounter of 09/21/23 (from the past 24 hours)  Glucose, capillary     Status: Abnormal   Collection Time: 09/21/23  9:01 AM  Result Value Ref Range   Glucose-Capillary 100 (H) 70 - 99 mg/dL  Glucose, capillary     Status: Abnormal   Collection Time: 09/21/23  9:41 AM  Result Value Ref Range   Glucose-Capillary 102 (H) 70 - 99 mg/dL     Exercise Prescription Changes - 09/21/23 1000       Response to Exercise   Blood Pressure (Admit) 130/70    Blood Pressure (Exercise) 124/82    Blood Pressure (Exit) 132/80    Heart Rate (Admit) 66 bpm    Heart Rate (Exercise) 75 bpm     Heart Rate (Exit) 77 bpm    Rating of Perceived Exertion (Exercise) 13    Symptoms Lightheaedness and fatigue    Comments Pt's first day in the [facility]2 program    Duration Progress to 30 minutes of  aerobic without signs/symptoms of physical distress    Intensity THRR unchanged      Progression   Progression Continue to progress workloads to maintain intensity without signs/symptoms of physical distress.    Average METs 1.6      Resistance Training   Training Prescription No    Weight Weights held today      Interval Training   Interval Training No      NuStep   Level 1    SPM 67    Minutes 10    METs 1.6          Social History   Tobacco Use  Smoking Status Never  Smokeless Tobacco Never    Goals Met:  Started exercise completed 10 minutes. Poor exercise tolerance.  Goals Unmet:  C/o feeling lightheaded.  Comments: Pt started cardiac rehab today.  Poor exercise tolerance please see above note VSS, telemetry-Sinus Rhythm,. Exercise stopped at 10 minutes due to complaints of feeling lightheaded. Please see additional documentation.  Medication list reconciled. Pt denies barriers to  medicaiton compliance.  PSYCHOSOCIAL ASSESSMENT:  PHQ-5. Pt exhibits positive coping skills, hopeful outlook with supportive family. No psychosocial needs identified at this time, no psychosocial interventions necessary.    Pt enjoys photography, writing, reading, hiking and walking.   Pt oriented to exercise equipment and routine.    Understanding verbalized. Hadassah Elpidio Quan RN BSN    Dr. Wilbert Bihari is Medical Director for Cardiac Rehab at [facility].

## 2023-09-21 NOTE — Progress Notes (Signed)
 Patient here for first day of exercise. Poor exercise tolerance. Reported feeling lightheaded during warm up stretches.  Telemetry rhythm Sinus rate 63. Sitting blood pressure 122/60. Standing blood pressure 144/72. CBG 100 pre exercise. Had patient stop warm up stretches. Symptoms resolved after rest. Patient completed 10 minutes of exercise and reported feeling lightheaded.  Blood pressure 124/82. Exercise stopped. No further complaints after resting. Will notify onsite provider Josefa Beauvais NP.

## 2023-09-23 ENCOUNTER — Encounter (HOSPITAL_COMMUNITY)
Admission: RE | Admit: 2023-09-23 | Discharge: 2023-09-23 | Disposition: A | Discharge status: Home / Self Care | Source: Ambulatory Visit | Attending: Cardiology | Admitting: Cardiology

## 2023-09-23 DIAGNOSIS — Z955 Presence of coronary angioplasty implant and graft: Secondary | ICD-10-CM | POA: Diagnosis not present

## 2023-09-23 LAB — GLUCOSE, CAPILLARY
Glucose-Capillary: 128 mg/dL — ABNORMAL HIGH (ref 70–99)
Glucose-Capillary: 108 mg/dL — ABNORMAL HIGH (ref 70–99)

## 2023-09-28 ENCOUNTER — Encounter (HOSPITAL_COMMUNITY)
Admission: RE | Admit: 2023-09-28 | Discharge: 2023-09-28 | Disposition: A | Discharge status: Home / Self Care | Source: Ambulatory Visit | Attending: Cardiology

## 2023-09-28 DIAGNOSIS — Z955 Presence of coronary angioplasty implant and graft: Secondary | ICD-10-CM

## 2023-09-30 ENCOUNTER — Encounter (HOSPITAL_COMMUNITY)
Admission: RE | Admit: 2023-09-30 | Discharge: 2023-09-30 | Disposition: A | Discharge status: Home / Self Care | Source: Ambulatory Visit | Attending: Cardiology | Admitting: Cardiology

## 2023-09-30 DIAGNOSIS — Z48812 Encounter for surgical aftercare following surgery on the circulatory system: Secondary | ICD-10-CM | POA: Insufficient documentation

## 2023-09-30 DIAGNOSIS — Z955 Presence of coronary angioplasty implant and graft: Secondary | ICD-10-CM | POA: Insufficient documentation

## 2023-10-05 ENCOUNTER — Encounter (HOSPITAL_COMMUNITY)
Admission: RE | Admit: 2023-10-05 | Discharge: 2023-10-05 | Disposition: A | Discharge status: Home / Self Care | Source: Ambulatory Visit | Attending: Cardiology | Admitting: Cardiology

## 2023-10-05 DIAGNOSIS — Z955 Presence of coronary angioplasty implant and graft: Secondary | ICD-10-CM

## 2023-10-07 ENCOUNTER — Encounter (HOSPITAL_COMMUNITY)
Admission: RE | Admit: 2023-10-07 | Discharge: 2023-10-07 | Disposition: A | Discharge status: Home / Self Care | Source: Ambulatory Visit | Attending: Cardiology | Admitting: Cardiology

## 2023-10-07 DIAGNOSIS — Z955 Presence of coronary angioplasty implant and graft: Secondary | ICD-10-CM

## 2023-10-12 ENCOUNTER — Encounter (HOSPITAL_COMMUNITY)
Admission: RE | Admit: 2023-10-12 | Discharge: 2023-10-12 | Disposition: A | Discharge status: Home / Self Care | Source: Ambulatory Visit | Attending: Cardiology

## 2023-10-12 DIAGNOSIS — Z955 Presence of coronary angioplasty implant and graft: Secondary | ICD-10-CM | POA: Diagnosis not present

## 2023-10-12 NOTE — Progress Notes (Signed)
 Cardiac Individual Treatment Plan  Patient Details  Name: Ashley Collier MRN: [id number] Date of Birth: [birth year] Referring Provider:   Flowsheet Row CARDIAC REHAB PHASE II ORIENTATION from 09/15/2023 in [facility], [facility], [facility]  Referring Provider Ashley Lipoma, MD Ashley Collier)  Ashley Collier, Ashley SAUNDERS, MD (covering)]    Initial Encounter Date:  Flowsheet Row CARDIAC REHAB PHASE II ORIENTATION from 09/15/2023 in [facility] for Heart, Vascular, & Lung Health  Date 09/15/23    Visit Diagnosis: 06/30/23 DES LAD, 07/27/23 DES LCx  Patient's Home Medications on Admission:  Current Outpatient Medications:    Acetaminophen  (TYLENOL  EXTRA STRENGTH PO), Take 650 mg by mouth 2 (two) times daily., Disp: , Rfl:    acidophilus (RISAQUAD) CAPS capsule, Take 1 capsule by mouth daily., Disp: , Rfl:    alendronate (FOSAMAX) 70 MG tablet, Take 70 mg by mouth once a week. Take with a full glass of water on an empty stomach., Disp: , Rfl:    aspirin EC 81 MG tablet, Take 81 mg by mouth daily. Swallow whole., Disp: , Rfl:    atorvastatin (LIPITOR) 80 MG tablet, Take 40 mg by mouth at bedtime., Disp: , Rfl:    b complex vitamins tablet, Take 1 tablet by mouth daily., Disp: , Rfl:    calcium carbonate (CALCIUM 600) 600 MG TABS tablet, Take 600 mg by mouth 2 (two) times daily with a meal., Disp: , Rfl:    clopidogrel (PLAVIX) 75 MG tablet, Take 75 mg by mouth daily., Disp: , Rfl:    cyanocobalamin (VITAMIN B12) 250 MCG tablet, Take 250 mcg by mouth daily., Disp: , Rfl:    ferrous gluconate (FERGON) 324 MG tablet, Take 324 mg by mouth daily with breakfast., Disp: , Rfl:    fluticasone (FLONASE) 50 MCG/ACT nasal spray, Place 1 spray into both nostrils daily. , Disp: , Rfl:    furosemide (LASIX) 20 MG tablet, Take 20 mg by mouth every 3 (three) days., Disp: , Rfl:    lisinopril (PRINIVIL,ZESTRIL) 10 MG tablet, Take 5 mg by mouth daily., Disp: , Rfl:     loratadine (CLARITIN) 10 MG tablet, Take 10 mg by mouth daily., Disp: , Rfl:    metFORMIN (GLUCOPHAGE) 1000 MG tablet, Take 500 mg by mouth 2 (two) times daily with a meal., Disp: , Rfl:    metoprolol succinate (TOPROL-XL) 25 MG 24 hr tablet, Take 25 mg by mouth daily. Hold if SBP<100 or HR<60, Disp: , Rfl:    montelukast (SINGULAIR) 10 MG tablet, Take 10 mg by mouth at bedtime., Disp: , Rfl:    Multiple Vitamin (MULTIVITAMIN) capsule, Take 1 capsule by mouth daily., Disp: , Rfl:    pantoprazole (PROTONIX) 40 MG tablet, Take 40 mg by mouth daily., Disp: , Rfl:    spironolactone (ALDACTONE) 25 MG tablet, Take 12.5 mg by mouth daily. Hold if SBP<110, Disp: , Rfl:    traZODone (DESYREL) 50 MG tablet, Take 50 mg by mouth at bedtime. Take 1/2-1 tablet nightly prn, Disp: , Rfl:    valACYclovir (VALTREX) 1000 MG tablet, Take 1,000 mg by mouth 2 (two) times daily as needed (cold sores). 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 with out break occurs   Pure hypercholesterolemia    Reflux esophagitis    Screening for other eye conditions    Type II or unspecified type diabetes mellitus without mention of complication, not stated as uncontrolled     Tobacco Use: Social History   Tobacco Use  Smoking Status Never  Smokeless Tobacco Never    Labs: Review Flowsheet       Latest Ref Rng & Units 02/08/2016  Labs for ITP Cardiac and Pulmonary Rehab  Hemoglobin A1c 4.8 - 5.6 % 6.5     Capillary Blood Glucose: Lab Results  Component Value Date   GLUCAP 108 (H) 09/23/2023   GLUCAP 128 (H) 09/23/2023    GLUCAP 102 (H) 09/21/2023   GLUCAP 100 (H) 09/21/2023   GLUCAP 105 (H) 09/15/2023     Exercise Target Goals: Exercise Program Goal: Individual exercise prescription set using results from initial 6 min walk test and THRR while considering  patient's activity barriers and safety.   Exercise Prescription Goal: Initial exercise prescription builds to 30-45 minutes a day of aerobic activity, 2-3 days per week.  Home exercise guidelines will be given to patient during program as part of exercise prescription that the participant will acknowledge.  Activity Barriers & Risk Stratification:  Activity Barriers & Cardiac Risk Stratification - 09/15/23 0939       Activity Barriers & Cardiac Risk Stratification   Activity Barriers Arthritis;Back Problems;History of Falls;Shortness of Breath;Deconditioning    Cardiac Risk Stratification High          6 Minute Walk:  6 Minute Walk     Row Name 09/15/23 1014         6 Minute Walk   Phase Initial     Distance 691 feet     Walk Time 4.58 minutes     # of Rest Breaks 1  Seated rest at 4 mins 35 seconds til end of test.     MPH 1.71     METS 1.62     RPE 15     Perceived Dyspnea  2     VO2 Peak 5.66     Symptoms Yes (comment)     Comments Shortness of breath, dizzy, lightheaded.     Resting HR 60 bpm     Resting BP 152/62     Resting Oxygen Saturation  99 %     Exercise Oxygen Saturation  during 6 min walk 100 %     Max Ex. HR 78 bpm     Max Ex. BP 162/70     2 Minute Post BP 142/62        Oxygen Initial Assessment:   Oxygen Re-Evaluation:   Oxygen Discharge (Final Oxygen Re-Evaluation):   Initial Exercise Prescription:  Initial Exercise Prescription - 09/15/23 1300       Date of Initial Exercise RX and Referring Provider   Date 09/15/23    Referring Provider Ashley Lipoma, MD ([facility])   Ashley Ashley SAUNDERS, MD (covering)   Expected Discharge Date 11/16/23      NuStep   Level 1    SPM 75    Minutes 30    METs  1.6      Prescription Details   Frequency (times per week) 2  Duration Progress to 30 minutes of continuous aerobic without signs/symptoms of physical distress      Intensity   THRR 40-80% of Max Heartrate 60-119    Ratings of Perceived Exertion 11-13    Perceived Dyspnea 0-4      Progression   Progression Continue to progress workloads to maintain intensity without signs/symptoms of physical distress.      Resistance Training   Training Prescription Yes    Weight 2 lbs    Reps 10-15          Perform Capillary Blood Glucose checks as needed.  Exercise Prescription Changes:   Exercise Prescription Changes     Row Name 09/21/23 1000 10/05/23 1030           Response to Exercise   Blood Pressure (Admit) 130/70 122/72      Blood Pressure (Exercise) 124/82 164/70      Blood Pressure (Exit) 132/80 146/68      Heart Rate (Admit) 66 bpm 61 bpm      Heart Rate (Exercise) 75 bpm 69 bpm      Heart Rate (Exit) 77 bpm 67 bpm      Rating of Perceived Exertion (Exercise) 13 13      Symptoms Lightheaedness and fatigue None      Comments Pt's first day in the CRP2 program Reviewed METs      Duration Progress to 30 minutes of  aerobic without signs/symptoms of physical distress Continue with 30 min of aerobic exercise without signs/symptoms of physical distress.      Intensity THRR unchanged THRR unchanged        Progression   Progression Continue to progress workloads to maintain intensity without signs/symptoms of physical distress. Continue to progress workloads to maintain intensity without signs/symptoms of physical distress.      Average METs 1.6 2.1        Resistance Training   Training Prescription No Yes      Weight Weights held today 2 lbs      Reps -- 10-15      Time -- 5 Minutes        Interval Training   Interval Training No No        NuStep   Level 1 2      SPM 67 74      Minutes 10 30      METs 1.6 2.1         Exercise Comments:   Exercise Comments      Row Name 09/21/23 1102 10/08/23 0905         Exercise Comments Pt has poor exercise tolerance. Pt was only able to tolerate 10 minutes on Nsutep. Pt had lightheadedness during warm-up and Nustep. Pt was assessed by RN. Pt will require slow progression. Warm-up will be done from the chair for energy conservation. See RN note in EPIC. Pt has made excellent progress in the 2 weeks since satring the program. Pt's has increased he exercise time to 30 minutes and increased her MET level. Pt reports feeling better since starting the program.         Exercise Goals and Review:   Exercise Goals     Row Name 09/15/23 0940             Exercise Goals   Increase Physical Activity Yes       Intervention Provide advice, education, support and counseling about physical activity/exercise needs.;Develop an individualized exercise prescription for aerobic and resistive training based  on initial evaluation findings, risk stratification, comorbidities and participant's personal goals.       Expected Outcomes Short Term: Attend rehab on a regular basis to increase amount of physical activity.;Long Term: Exercising regularly at least 3-5 days a week.;Long Term: Add in home exercise to make exercise part of routine and to increase amount of physical activity.       Increase Strength and Stamina Yes       Intervention Provide advice, education, support and counseling about physical activity/exercise needs.;Develop an individualized exercise prescription for aerobic and resistive training based on initial evaluation findings, risk stratification, comorbidities and participant's personal goals.       Expected Outcomes Short Term: Increase workloads from initial exercise prescription for resistance, speed, and METs.;Short Term: Perform resistance training exercises routinely during rehab and add in resistance training at home;Long Term: Improve cardiorespiratory fitness, muscular endurance and strength as measured  by increased METs and functional capacity ( )       Able to understand and use rate of perceived exertion (RPE) scale Yes       Intervention Provide education and explanation on how to use RPE scale       Expected Outcomes Short Term: Able to use RPE daily in rehab to express subjective intensity level;Long Term:  Able to use RPE to guide intensity level when exercising independently       Knowledge and understanding of Target Heart Rate Range (THRR) Yes       Intervention Provide education and explanation of THRR including how the numbers were predicted and where they are located for reference       Expected Outcomes Short Term: Able to state/look up THRR;Long Term: Able to use THRR to govern intensity when exercising independently;Short Term: Able to use daily as guideline for intensity in rehab       Understanding of Exercise Prescription Yes       Intervention Provide education, explanation, and written materials on patient's individual exercise prescription       Expected Outcomes Short Term: Able to explain program exercise prescription;Long Term: Able to explain home exercise prescription to exercise independently          Exercise Goals Re-Evaluation :  Exercise Goals Re-Evaluation     Row Name 09/21/23 1101             Exercise Goal Re-Evaluation   Exercise Goals Review Increase Physical Activity;Understanding of Exercise Prescription;Increase Strength and Stamina;Knowledge and understanding of Target Heart Rate Range (THRR);Able to understand and use rate of perceived exertion (RPE) scale       Comments Pt's first day in the CRP2 program. she understands the exercise Rx, RPE scale and THRR.       Expected Outcomes Will continue to montior patient and progress exercise workloads as tolerated.          Discharge Exercise Prescription (Final Exercise Prescription Changes):  Exercise Prescription Changes - 10/05/23 1030       Response to Exercise   Blood Pressure (Admit)  122/72    Blood Pressure (Exercise) 164/70    Blood Pressure (Exit) 146/68    Heart Rate (Admit) 61 bpm    Heart Rate (Exercise) 69 bpm    Heart Rate (Exit) 67 bpm    Rating of Perceived Exertion (Exercise) 13    Symptoms None    Comments Reviewed METs    Duration Continue with 30 min of aerobic exercise without signs/symptoms of physical distress.    Intensity THRR unchanged  Progression   Progression Continue to progress workloads to maintain intensity without signs/symptoms of physical distress.    Average METs 2.1      Resistance Training   Training Prescription Yes    Weight 2 lbs    Reps 10-15    Time 5 Minutes      Interval Training   Interval Training No      NuStep   Level 2    SPM 74    Minutes 30    METs 2.1          Nutrition:  Target Goals: Understanding of nutrition guidelines, daily intake of sodium 1500mg , cholesterol 200mg , calories 30% from fat and 7% or less from saturated fats, daily to have 5 or more servings of fruits and vegetables.  Biometrics:  Pre Biometrics - 09/15/23 0820       Pre Biometrics   Waist Circumference 39.5 inches    Hip Circumference 42 inches    Waist to Hip Ratio 0.94 %    Triceps Skinfold 19 mm    % Body Fat 39.3 %    Grip Strength 8 kg    Flexibility 12.13 in    Single Leg Stand 11.87 seconds           Nutrition Therapy Plan and Nutrition Goals:  Nutrition Therapy & Goals - 09/21/23 1024       Nutrition Therapy   Diet Heart Healthy Diet      Personal Nutrition Goals   Nutrition Goal Patient to identify strategies for reducing cardiovascular risk by attending the [employer] education and nutrition series weekly.    Personal Goal #2 Patient to improve diet quality by using the plate method as a guide for meal planning to include lean protein/plant protein, fruits, vegetables, whole grains, nonfat dairy as part of a well-balanced diet.    Comments Patient has medical history of HTN, Hyperlipidemia, DM2,  CAD, congestive heart failure. A1c is in a normal range; she continues metformin. Lipids are at goal. Patient will benefit from participation in intensive cardiac rehab for nutrition, exercise, and lifestyle modification.      Intervention Plan   Intervention Prescribe, educate and counsel regarding individualized specific dietary modifications aiming towards targeted core components such as weight, hypertension, lipid management, diabetes, heart failure and other comorbidities.;Nutrition handout(s) given to patient.    Expected Outcomes Short Term Goal: Understand basic principles of dietary content, such as calories, fat, sodium, cholesterol and nutrients.;Long Term Goal: Adherence to prescribed nutrition plan.          Nutrition Assessments:  Nutrition Assessments - 09/24/23 1417       Rate Your Plate Scores   Pre Score 62         MEDIFICTS Score Key: >=70 Need to make dietary changes  40-70 Heart Healthy Diet <= 40 Therapeutic Level Cholesterol Diet   Flowsheet Row CARDIAC REHAB PHASE II EXERCISE from 09/23/2023 in [facility] Heart, Vascular, & Lung Health  Picture Your Plate Total Score on Admission 62   Picture Your Plate Scores: <59 Unhealthy dietary pattern with much room for improvement. 41-50 Dietary pattern unlikely to meet recommendations for good health and room for improvement. 51-60 More healthful dietary pattern, with some room for improvement.  >60 Healthy dietary pattern, although there may be some specific behaviors that could be improved.    Nutrition Goals Re-Evaluation:  Nutrition Goals Re-Evaluation     Row Name 09/21/23 1024  Goals   Current Weight 139 lb 12.4 oz (63.4 kg)       Comment A1c 5.5, LDL 20, HDL 54       Expected Outcome Patient has medical history of HTN, Hyperlipidemia, DM2, CAD, congestive heart failure. A1c is in a normal range; she continues metformin. Lipids are at goal. Patient will  benefit from participation in intensive cardiac rehab for nutrition, exercise, and lifestyle modification.          Nutrition Goals Re-Evaluation:  Nutrition Goals Re-Evaluation     Row Name 09/21/23 1024             Goals   Current Weight 139 lb 12.4 oz (63.4 kg)       Comment A1c 5.5, LDL 20, HDL 54       Expected Outcome Patient has medical history of HTN, Hyperlipidemia, DM2, CAD, congestive heart failure. A1c is in a normal range; she continues metformin. Lipids are at goal. Patient will benefit from participation in intensive cardiac rehab for nutrition, exercise, and lifestyle modification.          Nutrition Goals Discharge (Final Nutrition Goals Re-Evaluation):  Nutrition Goals Re-Evaluation - 09/21/23 1024       Goals   Current Weight 139 lb 12.4 oz (63.4 kg)    Comment A1c 5.5, LDL 20, HDL 54    Expected Outcome Patient has medical history of HTN, Hyperlipidemia, DM2, CAD, congestive heart failure. A1c is in a normal range; she continues metformin. Lipids are at goal. Patient will benefit from participation in intensive cardiac rehab for nutrition, exercise, and lifestyle modification.          Psychosocial: Target Goals: Acknowledge presence or absence of significant depression and/or stress, maximize coping skills, provide positive support system. Participant is able to verbalize types and ability to use techniques and skills needed for reducing stress and depression.  Initial Review & Psychosocial Screening:  Initial Psych Review & Screening - 09/15/23 0949       Initial Review   Current issues with Current Stress Concerns    Source of Stress Concerns Financial    Comments Does breathing exercises / calming exercises if stress level is elevated.      Family Dynamics   Good Support System? Yes    Comments Ashley Collier has support from her husband and sister who she lives with.      Barriers   Psychosocial barriers to participate in program The patient should  benefit from training in stress management and relaxation.;Psychosocial barriers identified (see note)      Screening Interventions   Interventions Encouraged to exercise;To provide support and resources with identified psychosocial needs;Provide feedback about the scores to participant    Expected Outcomes Short Term goal: Utilizing psychosocial counselor, staff and physician to assist with identification of specific Stressors or current issues interfering with healing process. Setting desired goal for each stressor or current issue identified.;Long Term Goal: Stressors or current issues are controlled or eliminated.;Short Term goal: Identification and review with participant of any Quality of Life or Depression concerns found by scoring the questionnaire.;Long Term goal: The participant improves quality of Life and PHQ9 Scores as seen by post scores and/or verbalization of changes          Quality of Life Scores:  Quality of Life - 09/15/23 1314       Quality of Life   Select Quality of Life      Quality of Life Scores   Health/Function Pre 21.93 %  Socioeconomic Pre 29.64 %    Psych/Spiritual Pre 26.57 %    Family Pre 30 %    GLOBAL Pre 25.66 %         Scores of 19 and below usually indicate a poorer quality of life in these areas.  A difference of  2-3 points is a clinically meaningful difference.  A difference of 2-3 points in the total score of the Quality of Life Index has been associated with significant improvement in overall quality of life, self-image, physical symptoms, and general health in studies assessing change in quality of life.  PHQ-9: Review Flowsheet       09/15/2023  Depression screen PHQ 2/9  Decreased Interest 0  Down, Depressed, Hopeless 0  PHQ - 2 Score 0  Altered sleeping 1  Tired, decreased energy 1  Change in appetite 1  Feeling bad or failure about yourself  0  Trouble concentrating 1  Moving slowly or fidgety/restless 1  Suicidal thoughts 0   PHQ-9 Score 5  Difficult doing work/chores Somewhat difficult   Interpretation of Total Score  Total Score Depression Severity:  1-4 = Minimal depression, 5-9 = Mild depression, 10-14 = Moderate depression, 15-19 = Moderately severe depression, 20-27 = Severe depression   Psychosocial Evaluation and Intervention:   Psychosocial Re-Evaluation:  Psychosocial Re-Evaluation     Row Name 09/29/23 0855 10/01/23 1729           Psychosocial Re-Evaluation   Current issues with Current Stress Concerns Current Stress Concerns      Comments Ashley Collier has not voiced any increased concerns or stressors since starting cardiac rehab. Will review  PHQ2-9 in the upcoming week PHQ2-9 reviewed. Ashley Collier says she feels much better and has more energy. Ashley Collier says she feels like a switch has been turned on for her!      Expected Outcomes Ashley Collier will have controlled or decreased stressors upon completion of cardiac rehab Ashley Collier will have controlled or decreased stressors upon completion of cardiac rehab      Interventions Stress management education;Encouraged to attend Cardiac Rehabilitation for the exercise;Relaxation education Stress management education;Encouraged to attend Cardiac Rehabilitation for the exercise;Relaxation education      Continue Psychosocial Services  Follow up required by staff No Follow up required        Initial Review   Source of Stress Concerns Financial Financial      Comments Will continue to montior and offer support as needed Will continue to montior and offer support as needed         Psychosocial Discharge (Final Psychosocial Re-Evaluation):  Psychosocial Re-Evaluation - 10/01/23 1729       Psychosocial Re-Evaluation   Current issues with Current Stress Concerns    Comments PHQ2-9 reviewed. Ashley Collier says she feels much better and has more energy. Ashley Collier says she feels like a switch has been turned on for her!    Expected Outcomes Ashley Collier will have controlled or decreased stressors upon  completion of cardiac rehab    Interventions Stress management education;Encouraged to attend Cardiac Rehabilitation for the exercise;Relaxation education    Continue Psychosocial Services  No Follow up required      Initial Review   Source of Stress Concerns Financial    Comments Will continue to montior and offer support as needed          Vocational Rehabilitation: Provide vocational rehab assistance to qualifying candidates.   Vocational Rehab Evaluation & Intervention:  Vocational Rehab - 09/15/23 9047  Initial Vocational Rehab Evaluation & Intervention   Assessment shows need for Vocational Rehabilitation No      Vocational Rehab Re-Evaulation   Comments Ashley Collier is retired and has no VR needs at this time.          Education: Education Goals: Education classes will be provided on a weekly basis, covering required topics. Participant will state understanding/return demonstration of topics presented.    Education     Row Name 09/21/23 0800     Education   Cardiac Education Topics [employer]   Select Core Videos     Core Videos   Educator Exercise Physiologist   Select Exercise Education   Exercise Education Biomechanial Limitations   Instruction Review Code 1- Verbalizes Understanding   Class Start Time 0815   Class Stop Time 0847   Class Time Calculation (min) 32 min    Row Name 09/23/23 1000     Education   Cardiac Education Topics [employer]   Orthoptist   Educator Dietitian   Weekly Topic Fast Evening Meals   Instruction Review Code 1- Verbalizes Understanding   Class Start Time 0815   Class Stop Time 0856   Class Time Calculation (min) 41 min    Row Name 09/28/23 0900     Education   Cardiac Education Topics [employer]   Glass blower/designer Nutrition   Nutrition Workshop Fueling a Forensic psychologist   Instruction Review Code 1- Verbalizes Understanding   Class Start Time  0815   Class Stop Time 0855   Class Time Calculation (min) 40 min    Row Name 09/30/23 0900     Education   Cardiac Education Topics [employer]   Customer service manager   Weekly Topic International Cuisine- Spotlight on the [facility] Zones   Instruction Review Code 1- Verbalizes Understanding   Class Start Time 0815   Class Stop Time 0850   Class Time Calculation (min) 35 min    Row Name 10/05/23 0800     Education   Cardiac Education Topics [employer]   Select Workshops     Workshops   Educator Exercise Physiologist   Select Psychosocial   Psychosocial Workshop Healthy Sleep for a Healthy Heart   Instruction Review Code 1- Verbalizes Understanding   Class Start Time 0815   Class Stop Time 0900   Class Time Calculation (min) 45 min    Row Name 10/07/23 0900     Education   Cardiac Education Topics [employer]   Secondary school teacher School   Educator Dietitian   Weekly Topic Simple Sides and Sauces   Instruction Review Code 1- Verbalizes Understanding   Class Start Time 0815   Class Stop Time 0846   Class Time Calculation (min) 31 min    Row Name 10/12/23 0800     Education   Cardiac Education Topics Pritikin     Core Videos   Educator Exercise Physiologist   Select General Education   General Education Hypertension and Heart Disease   Instruction Review Code 1- Verbalizes Understanding   Class Start Time 0815   Class Stop Time 0851   Class Time Calculation (min) 36 min      Core Videos: Exercise    Move It!  Clinical staff conducted group or individual video education with verbal and written material and  guidebook.  Patient learns the recommended Pritikin exercise program. Exercise with the goal of living a long, healthy life. Some of the health benefits of exercise include controlled diabetes, healthier blood pressure levels, improved cholesterol levels, improved heart and lung capacity, improved sleep, and  better body composition. Everyone should speak with their doctor before starting or changing an exercise routine.  Biomechanical Limitations Clinical staff conducted group or individual video education with verbal and written material and guidebook.  Patient learns how biomechanical limitations can impact exercise and how we can mitigate and possibly overcome limitations to have an impactful and balanced exercise routine.  Body Composition Clinical staff conducted group or individual video education with verbal and written material and guidebook.  Patient learns that body composition (ratio of muscle mass to fat mass) is a key component to assessing overall fitness, rather than body weight alone. Increased fat mass, especially visceral belly fat, can put us  at increased risk for metabolic syndrome, type 2 diabetes, heart disease, and even death. It is recommended to combine diet and exercise (cardiovascular and resistance training) to improve your body composition. Seek guidance from your physician and exercise physiologist before implementing an exercise routine.  Exercise Action Plan Clinical staff conducted group or individual video education with verbal and written material and guidebook.  Patient learns the recommended strategies to achieve and enjoy long-term exercise adherence, including variety, self-motivation, self-efficacy, and positive decision making. Benefits of exercise include fitness, good health, weight management, more energy, better sleep, less stress, and overall well-being.  Medical   Heart Disease Risk Reduction Clinical staff conducted group or individual video education with verbal and written material and guidebook.  Patient learns our heart is our most vital organ as it circulates oxygen, nutrients, white blood cells, and hormones throughout the entire body, and carries waste away. Data supports a plant-based eating plan like the Pritikin Program for its effectiveness in  slowing progression of and reversing heart disease. The video provides a number of recommendations to address heart disease.   Metabolic Syndrome and Belly Fat  Clinical staff conducted group or individual video education with verbal and written material and guidebook.  Patient learns what metabolic syndrome is, how it leads to heart disease, and how one can reverse it and keep it from coming back. You have metabolic syndrome if you have 3 of the following 5 criteria: abdominal obesity, high blood pressure, high triglycerides, low HDL cholesterol, and high blood sugar.  Hypertension and Heart Disease Clinical staff conducted group or individual video education with verbal and written material and guidebook.  Patient learns that high blood pressure, or hypertension, is very common in the [place] . Hypertension is largely due to excessive salt intake, but other important risk factors include being overweight, physical inactivity, drinking too much alcohol, smoking, and not eating enough potassium from fruits and vegetables. High blood pressure is a leading risk factor for heart attack, stroke, congestive heart failure, dementia, kidney failure, and premature death. Long-term effects of excessive salt intake include stiffening of the arteries and thickening of heart muscle and organ damage. Recommendations include ways to reduce hypertension and the risk of heart disease.  Diseases of Our Time - Focusing on Diabetes Clinical staff conducted group or individual video education with verbal and written material and guidebook.  Patient learns why the best way to stop diseases of our time is prevention, through food and other lifestyle changes. Medicine (such as prescription pills and surgeries) is often only a Band-Aid on the problem,  not a long-term solution. Most common diseases of our time include obesity, type 2 diabetes, hypertension, heart disease, and cancer. The Pritikin Program is recommended and  has been proven to help reduce, reverse, and/or prevent the damaging effects of metabolic syndrome.  Nutrition   Overview of the Pritikin Eating Plan  Clinical staff conducted group or individual video education with verbal and written material and guidebook.  Patient learns about the Pritikin Eating Plan for disease risk reduction. The Pritikin Eating Plan emphasizes a wide variety of unrefined, minimally-processed carbohydrates, like fruits, vegetables, whole grains, and legumes. Go, Caution, and Stop food choices are explained. Plant-based and lean animal proteins are emphasized. Rationale provided for low sodium intake for blood pressure control, low added sugars for blood sugar stabilization, and low added fats and oils for coronary artery disease risk reduction and weight management.  Calorie Density  Clinical staff conducted group or individual video education with verbal and written material and guidebook.  Patient learns about calorie density and how it impacts the Pritikin Eating Plan. Knowing the characteristics of the food you choose will help you decide whether those foods will lead to weight gain or weight loss, and whether you want to consume more or less of them. Weight loss is usually a side effect of the Pritikin Eating Plan because of its focus on low calorie-dense foods.  Label Reading  Clinical staff conducted group or individual video education with verbal and written material and guidebook.  Patient learns about the Pritikin recommended label reading guidelines and corresponding recommendations regarding calorie density, added sugars, sodium content, and whole grains.  Dining Out - Part 1  Clinical staff conducted group or individual video education with verbal and written material and guidebook.  Patient learns that restaurant meals can be sabotaging because they can be so high in calories, fat, sodium, and/or sugar. Patient learns recommended strategies on how to positively  address this and avoid unhealthy pitfalls.  Facts on Fats  Clinical staff conducted group or individual video education with verbal and written material and guidebook.  Patient learns that lifestyle modifications can be just as effective, if not more so, as many medications for lowering your risk of heart disease. A Pritikin lifestyle can help to reduce your risk of inflammation and atherosclerosis (cholesterol build-up, or plaque, in the artery walls). Lifestyle interventions such as dietary choices and physical activity address the cause of atherosclerosis. A review of the types of fats and their impact on blood cholesterol levels, along with dietary recommendations to reduce fat intake is also included.  Nutrition Action Plan  Clinical staff conducted group or individual video education with verbal and written material and guidebook.  Patient learns how to incorporate Pritikin recommendations into their lifestyle. Recommendations include planning and keeping personal health goals in mind as an important part of their success.  Healthy Mind-Set    Healthy Minds, Bodies, Hearts  Clinical staff conducted group or individual video education with verbal and written material and guidebook.  Patient learns how to identify when they are stressed. Video will discuss the impact of that stress, as well as the many benefits of stress management. Patient will also be introduced to stress management techniques. The way we think, act, and feel has an impact on our hearts.  How Our Thoughts Can Heal Our Hearts  Clinical staff conducted group or individual video education with verbal and written material and guidebook.  Patient learns that negative thoughts can cause depression and anxiety. This can result in  negative lifestyle behavior and serious health problems. Cognitive behavioral therapy is an effective method to help control our thoughts in order to change and improve our emotional outlook.  Additional  Videos:  Exercise    Improving Performance  Clinical staff conducted group or individual video education with verbal and written material and guidebook.  Patient learns to use a non-linear approach by alternating intensity levels and lengths of time spent exercising to help burn more calories and lose more body fat. Cardiovascular exercise helps improve heart health, metabolism, hormonal balance, blood sugar control, and recovery from fatigue. Resistance training improves strength, endurance, balance, coordination, reaction time, metabolism, and muscle mass. Flexibility exercise improves circulation, posture, and balance. Seek guidance from your physician and exercise physiologist before implementing an exercise routine and learn your capabilities and proper form for all exercise.  Introduction to Yoga  Clinical staff conducted group or individual video education with verbal and written material and guidebook.  Patient learns about yoga, a discipline of the coming together of mind, breath, and body. The benefits of yoga include improved flexibility, improved range of motion, better posture and core strength, increased lung function, weight loss, and positive self-image. Yoga's heart health benefits include lowered blood pressure, healthier heart rate, decreased cholesterol and triglyceride levels, improved immune function, and reduced stress. Seek guidance from your physician and exercise physiologist before implementing an exercise routine and learn your capabilities and proper form for all exercise.  Medical   Aging: Enhancing Your Quality of Life  Clinical staff conducted group or individual video education with verbal and written material and guidebook.  Patient learns key strategies and recommendations to stay in good physical health and enhance quality of life, such as prevention strategies, having an advocate, securing a Health Care Proxy and Power of Attorney, and keeping a list of medications  and system for tracking them. It also discusses how to avoid risk for bone loss.  Biology of Weight Control  Clinical staff conducted group or individual video education with verbal and written material and guidebook.  Patient learns that weight gain occurs because we consume more calories than we burn (eating more, moving less). Even if your body weight is normal, you may have higher ratios of fat compared to muscle mass. Too much body fat puts you at increased risk for cardiovascular disease, heart attack, stroke, type 2 diabetes, and obesity-related cancers. In addition to exercise, following the Pritikin Eating Plan can help reduce your risk.  Decoding Lab Results  Clinical staff conducted group or individual video education with verbal and written material and guidebook.  Patient learns that lab test reflects one measurement whose values change over time and are influenced by many factors, including medication, stress, sleep, exercise, food, hydration, pre-existing medical conditions, and more. It is recommended to use the knowledge from this video to become more involved with your lab results and evaluate your numbers to speak with your doctor.   Diseases of Our Time - Overview  Clinical staff conducted group or individual video education with verbal and written material and guidebook.  Patient learns that according to the CDC, 50% to 70% of chronic diseases (such as obesity, type 2 diabetes, elevated lipids, hypertension, and heart disease) are avoidable through lifestyle improvements including healthier food choices, listening to satiety cues, and increased physical activity.  Sleep Disorders Clinical staff conducted group or individual video education with verbal and written material and guidebook.  Patient learns how good quality and duration of sleep are important to overall  health and well-being. Patient also learns about sleep disorders and how they impact health along with  recommendations to address them, including discussing with a physician.  Nutrition  Dining Out - Part 2 Clinical staff conducted group or individual video education with verbal and written material and guidebook.  Patient learns how to plan ahead and communicate in order to maximize their dining experience in a healthy and nutritious manner. Included are recommended food choices based on the type of restaurant the patient is visiting.   Fueling a Banker conducted group or individual video education with verbal and written material and guidebook.  There is a strong connection between our food choices and our health. Diseases like obesity and type 2 diabetes are very prevalent and are in large-part due to lifestyle choices. The Pritikin Eating Plan provides plenty of food and hunger-curbing satisfaction. It is easy to follow, affordable, and helps reduce health risks.  Menu Workshop  Clinical staff conducted group or individual video education with verbal and written material and guidebook.  Patient learns that restaurant meals can sabotage health goals because they are often packed with calories, fat, sodium, and sugar. Recommendations include strategies to plan ahead and to communicate with the manager, chef, or server to help order a healthier meal.  Planning Your Eating Strategy  Clinical staff conducted group or individual video education with verbal and written material and guidebook.  Patient learns about the Pritikin Eating Plan and its benefit of reducing the risk of disease. The Pritikin Eating Plan does not focus on calories. Instead, it emphasizes high-quality, nutrient-rich foods. By knowing the characteristics of the foods, we choose, we can determine their calorie density and make informed decisions.  Targeting Your Nutrition Priorities  Clinical staff conducted group or individual video education with verbal and written material and guidebook.  Patient learns  that lifestyle habits have a tremendous impact on disease risk and progression. This video provides eating and physical activity recommendations based on your personal health goals, such as reducing LDL cholesterol, losing weight, preventing or controlling type 2 diabetes, and reducing high blood pressure.  Vitamins and Minerals  Clinical staff conducted group or individual video education with verbal and written material and guidebook.  Patient learns different ways to obtain key vitamins and minerals, including through a recommended healthy diet. It is important to discuss all supplements you take with your doctor.   Healthy Mind-Set    Smoking Cessation  Clinical staff conducted group or individual video education with verbal and written material and guidebook.  Patient learns that cigarette smoking and tobacco addiction pose a serious health risk which affects millions of people. Stopping smoking will significantly reduce the risk of heart disease, lung disease, and many forms of cancer. Recommended strategies for quitting are covered, including working with your doctor to develop a successful plan.  Culinary   Becoming a Set designer conducted group or individual video education with verbal and written material and guidebook.  Patient learns that cooking at home can be healthy, cost-effective, quick, and puts them in control. Keys to cooking healthy recipes will include looking at your recipe, assessing your equipment needs, planning ahead, making it simple, choosing cost-effective seasonal ingredients, and limiting the use of added fats, salts, and sugars.  Cooking - Breakfast and Snacks  Clinical staff conducted group or individual video education with verbal and written material and guidebook.  Patient learns how important breakfast is to satiety and nutrition through the entire  day. Recommendations include key foods to eat during breakfast to help stabilize blood sugar  levels and to prevent overeating at meals later in the day. Planning ahead is also a key component.  Cooking - Educational psychologist conducted group or individual video education with verbal and written material and guidebook.  Patient learns eating strategies to improve overall health, including an approach to cook more at home. Recommendations include thinking of animal protein as a side on your plate rather than center stage and focusing instead on lower calorie dense options like vegetables, fruits, whole grains, and plant-based proteins, such as beans. Making sauces in large quantities to freeze for later and leaving the skin on your vegetables are also recommended to maximize your experience.  Cooking - Healthy Salads and Dressing Clinical staff conducted group or individual video education with verbal and written material and guidebook.  Patient learns that vegetables, fruits, whole grains, and legumes are the foundations of the Pritikin Eating Plan. Recommendations include how to incorporate each of these in flavorful and healthy salads, and how to create homemade salad dressings. Proper handling of ingredients is also covered. Cooking - Soups and [employer] - Soups and Desserts Clinical staff conducted group or individual video education with verbal and written material and guidebook.  Patient learns that Pritikin soups and desserts make for easy, nutritious, and delicious snacks and meal components that are low in sodium, fat, sugar, and calorie density, while high in vitamins, minerals, and filling fiber. Recommendations include simple and healthy ideas for soups and desserts.   Overview     The Pritikin Solution Program Overview Clinical staff conducted group or individual video education with verbal and written material and guidebook.  Patient learns that the results of the Pritikin Program have been documented in more than 100 articles published in peer-reviewed  journals, and the benefits include reducing risk factors for (and, in some cases, even reversing) high cholesterol, high blood pressure, type 2 diabetes, obesity, and more! An overview of the three key pillars of the Pritikin Program will be covered: eating well, doing regular exercise, and having a healthy mind-set.  WORKSHOPS  Exercise: Exercise Basics: Building Your Action Plan Clinical staff led group instruction and group discussion with PowerPoint presentation and patient guidebook. To enhance the learning environment the use of posters, models and videos may be added. At the conclusion of this workshop, patients will comprehend the difference between physical activity and exercise, as well as the benefits of incorporating both, into their routine. Patients will understand the FITT (Frequency, Intensity, Time, and Type) principle and how to use it to build an exercise action plan. In addition, safety concerns and other considerations for exercise and cardiac rehab will be addressed by the presenter. The purpose of this lesson is to promote a comprehensive and effective weekly exercise routine in order to improve patients' overall level of fitness.   Managing Heart Disease: Your Path to a Healthier Heart Clinical staff led group instruction and group discussion with PowerPoint presentation and patient guidebook. To enhance the learning environment the use of posters, models and videos may be added.At the conclusion of this workshop, patients will understand the anatomy and physiology of the heart. Additionally, they will understand how Pritikin's three pillars impact the risk factors, the progression, and the management of heart disease.  The purpose of this lesson is to provide a high-level overview of the heart, heart disease, and how the Pritikin lifestyle positively impacts risk factors.  Exercise Biomechanics Clinical staff led group instruction and group discussion with PowerPoint  presentation and patient guidebook. To enhance the learning environment the use of posters, models and videos may be added. Patients will learn how the structural parts of their bodies function and how these functions impact their daily activities, movement, and exercise. Patients will learn how to promote a neutral spine, learn how to manage pain, and identify ways to improve their physical movement in order to promote healthy living. The purpose of this lesson is to expose patients to common physical limitations that impact physical activity. Participants will learn practical ways to adapt and manage aches and pains, and to minimize their effect on regular exercise. Patients will learn how to maintain good posture while sitting, walking, and lifting.  Balance Training and Fall Prevention  Clinical staff led group instruction and group discussion with PowerPoint presentation and patient guidebook. To enhance the learning environment the use of posters, models and videos may be added. At the conclusion of this workshop, patients will understand the importance of their sensorimotor skills (vision, proprioception, and the vestibular system) in maintaining their ability to balance as they age. Patients will apply a variety of balancing exercises that are appropriate for their current level of function. Patients will understand the common causes for poor balance, possible solutions to these problems, and ways to modify their physical environment in order to minimize their fall risk. The purpose of this lesson is to teach patients about the importance of maintaining balance as they age and ways to minimize their risk of falling.  WORKSHOPS   Nutrition:  Fueling a Ship broker led group instruction and group discussion with PowerPoint presentation and patient guidebook. To enhance the learning environment the use of posters, models and videos may be added. Patients will review the  foundational principles of the Pritikin Eating Plan and understand what constitutes a serving size in each of the food groups. Patients will also learn Pritikin-friendly foods that are better choices when away from home and review make-ahead meal and snack options. Calorie density will be reviewed and applied to three nutrition priorities: weight maintenance, weight loss, and weight gain. The purpose of this lesson is to reinforce (in a group setting) the key concepts around what patients are recommended to eat and how to apply these guidelines when away from home by planning and selecting Pritikin-friendly options. Patients will understand how calorie density may be adjusted for different weight management goals.  Mindful Eating  Clinical staff led group instruction and group discussion with PowerPoint presentation and patient guidebook. To enhance the learning environment the use of posters, models and videos may be added. Patients will briefly review the concepts of the Pritikin Eating Plan and the importance of low-calorie dense foods. The concept of mindful eating will be introduced as well as the importance of paying attention to internal hunger signals. Triggers for non-hunger eating and techniques for dealing with triggers will be explored. The purpose of this lesson is to provide patients with the opportunity to review the basic principles of the Pritikin Eating Plan, discuss the value of eating mindfully and how to measure internal cues of hunger and fullness using the Hunger Scale. Patients will also discuss reasons for non-hunger eating and learn strategies to use for controlling emotional eating.  Targeting Your Nutrition Priorities Clinical staff led group instruction and group discussion with PowerPoint presentation and patient guidebook. To enhance the learning environment the use of posters, models and videos may be  added. Patients will learn how to determine their genetic susceptibility to  disease by reviewing their family history. Patients will gain insight into the importance of diet as part of an overall healthy lifestyle in mitigating the impact of genetics and other environmental insults. The purpose of this lesson is to provide patients with the opportunity to assess their personal nutrition priorities by looking at their family history, their own health history and current risk factors. Patients will also be able to discuss ways of prioritizing and modifying the Pritikin Eating Plan for their highest risk areas  Menu  Clinical staff led group instruction and group discussion with PowerPoint presentation and patient guidebook. To enhance the learning environment the use of posters, models and videos may be added. Using menus brought in from [employer], or printed from [employer], patients will apply the Pritikin dining out guidelines that were presented in the [employer] video. Patients will also be able to practice these guidelines in a variety of provided scenarios. The purpose of this lesson is to provide patients with the opportunity to practice hands-on learning of the Pritikin Dining Out guidelines with actual menus and practice scenarios.  Label Reading Clinical staff led group instruction and group discussion with PowerPoint presentation and patient guidebook. To enhance the learning environment the use of posters, models and videos may be added. Patients will review and discuss the Pritikin label reading guidelines presented in [employer]'s Label Reading Educational series video. Using fool labels brought in from local grocery stores and markets, patients will apply the label reading guidelines and determine if the packaged food meet the Pritikin guidelines. The purpose of this lesson is to provide patients with the opportunity to review, discuss, and practice hands-on learning of the Pritikin Label Reading guidelines with actual packaged food  labels. Cooking School  [employer]'s [employer] are designed to teach patients ways to prepare quick, simple, and affordable recipes at home. The importance of nutrition's role in chronic disease risk reduction is reflected in its emphasis in the overall Pritikin program. By learning how to prepare essential core Pritikin Eating Plan recipes, patients will increase control over what they eat; be able to customize the flavor of foods without the use of added salt, sugar, or fat; and improve the quality of the food they consume. By learning a set of core recipes which are easily assembled, quickly prepared, and affordable, patients are more likely to prepare more healthy foods at home. These workshops focus on convenient breakfasts, simple entres, side dishes, and desserts which can be prepared with minimal effort and are consistent with nutrition recommendations for cardiovascular risk reduction. Cooking [employer] are taught by a Armed forces logistics/support/administrative officer (RD) who has been trained by the [employer]. The chef or RD has a clear understanding of the importance of minimizing - if not completely eliminating - added fat, sugar, and sodium in recipes. Throughout the series of Cooking School Workshop sessions, patients will learn about healthy ingredients and efficient methods of cooking to build confidence in their capability to prepare    Cooking School weekly topics:  Adding Flavor- Sodium-Free  Fast and Healthy Breakfasts  Powerhouse Plant-Based Proteins  Satisfying Salads and Dressings  Simple Sides and Sauces  International Cuisine-Spotlight on the [employer] Zones  Delicious Desserts  Savory Soups  [employer] - Meals in a Astronomer Appetizers and Snacks  Comforting Weekend Breakfasts  One-Pot Wonders   Fast Evening Meals  Easy Entertaining  Personalizing Your Pritikin Plate  WORKSHOPS   Healthy Mindset (Psychosocial):  Focused Goals, Sustainable  Changes Clinical staff led group instruction and group discussion with PowerPoint presentation and patient guidebook. To enhance the learning environment the use of posters, models and videos may be added. Patients will be able to apply effective goal setting strategies to establish at least one personal goal, and then take consistent, meaningful action toward that goal. They will learn to identify common barriers to achieving personal goals and develop strategies to overcome them. Patients will also gain an understanding of how our mind-set can impact our ability to achieve goals and the importance of cultivating a positive and growth-oriented mind-set. The purpose of this lesson is to provide patients with a deeper understanding of how to set and achieve personal goals, as well as the tools and strategies needed to overcome common obstacles which may arise along the way.  From Head to Heart: The Power of a Healthy Outlook  Clinical staff led group instruction and group discussion with PowerPoint presentation and patient guidebook. To enhance the learning environment the use of posters, models and videos may be added. Patients will be able to recognize and describe the impact of emotions and mood on physical health. They will discover the importance of self-care and explore self-care practices which may work for them. Patients will also learn how to utilize the 4 C's to cultivate a healthier outlook and better manage stress and challenges. The purpose of this lesson is to demonstrate to patients how a healthy outlook is an essential part of maintaining good health, especially as they continue their cardiac rehab journey.  Healthy Sleep for a Healthy Heart Clinical staff led group instruction and group discussion with PowerPoint presentation and patient guidebook. To enhance the learning environment the use of posters, models and videos may be added. At the conclusion of this workshop, patients will be able  to demonstrate knowledge of the importance of sleep to overall health, well-being, and quality of life. They will understand the symptoms of, and treatments for, common sleep disorders. Patients will also be able to identify daytime and nighttime behaviors which impact sleep, and they will be able to apply these tools to help manage sleep-related challenges. The purpose of this lesson is to provide patients with a general overview of sleep and outline the importance of quality sleep. Patients will learn about a few of the most common sleep disorders. Patients will also be introduced to the concept of "sleep hygiene," and discover ways to self-manage certain sleeping problems through simple daily behavior changes. Finally, the workshop will motivate patients by clarifying the links between quality sleep and their goals of heart-healthy living.   Recognizing and Reducing Stress Clinical staff led group instruction and group discussion with PowerPoint presentation and patient guidebook. To enhance the learning environment the use of posters, models and videos may be added. At the conclusion of this workshop, patients will be able to understand the types of stress reactions, differentiate between acute and chronic stress, and recognize the impact that chronic stress has on their health. They will also be able to apply different coping mechanisms, such as reframing negative self-talk. Patients will have the opportunity to practice a variety of stress management techniques, such as deep abdominal breathing, progressive muscle relaxation, and/or guided imagery.  The purpose of this lesson is to educate patients on the role of stress in their lives and to provide healthy techniques for coping with it.  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   Learning  Preferences Video          Education Topics:  Knowledge Questionnaire Score:  Knowledge Questionnaire Score - 09/15/23 1315       Knowledge Questionnaire Score   Pre Score 19/24          Core Components/Risk Factors/Patient Goals at Admission:  Personal Goals and Risk Factors at Admission - 09/15/23 0940       Core Components/Risk Factors/Patient Goals on Admission   Hypertension Yes    Intervention Provide education on lifestyle modifcations including regular physical activity/exercise, weight management, moderate sodium restriction and increased consumption of fresh fruit, vegetables, and low fat dairy, alcohol moderation, and smoking cessation.;Monitor prescription use compliance.    Expected Outcomes Short Term: Continued assessment and intervention until BP is < 140/89mm HG in hypertensive participants. < 130/6mm HG in hypertensive participants with diabetes, heart failure or chronic kidney disease.;Long Term: Maintenance of blood pressure at goal levels.    Stress Yes    Intervention Offer individual and/or small group education and counseling on adjustment to heart disease, stress management and health-related lifestyle change. Teach and support self-help strategies.;Refer participants experiencing significant psychosocial distress to appropriate mental health specialists for further evaluation and treatment. When possible, include family members and significant others in education/counseling sessions.    Expected Outcomes Short Term: Participant demonstrates changes in health-related behavior, relaxation and other stress management skills, ability to obtain effective social support, and compliance with psychotropic medications if prescribed.;Long Term: Emotional wellbeing is indicated by absence of clinically significant psychosocial distress or social isolation.          Core Components/Risk Factors/Patient Goals Review:   Goals and Risk Factor Review     Row Name  09/29/23 0901             Core Components/Risk Factors/Patient Goals Review   Personal Goals Review Hypertension;Lipids;Stress;Weight Management/Obesity       Review Ashley Collier started cardiac rehab on 09/29/23. Ashley Collier has increased her time on the nustep from 10 minutes to 15 minutes. vital sings and CBG's have been stable.       Expected Outcomes Ashley Collier will continue to particpate in cardiac rehab for exercise, nutrition and lifestyle modifications          Core Components/Risk Factors/Patient Goals at Discharge (Final Review):   Goals and Risk Factor Review - 09/29/23 0901       Core Components/Risk Factors/Patient Goals Review   Personal Goals Review Hypertension;Lipids;Stress;Weight Management/Obesity    Review Ashley Collier started cardiac rehab on 09/29/23. Ashley Collier has increased her time on the nustep from 10 minutes to 15 minutes. vital sings and CBG's have been stable.    Expected Outcomes Sharnette will continue to particpate in cardiac rehab for exercise, nutrition and lifestyle modifications          ITP Comments:  ITP Comments     Row Name 09/15/23 0820 09/29/23 0843         ITP Comments Medical Director- Dr. Wilbert Bihari, MD. Introduction to the Cardiac Rehab Program. Review of intital orientation folder. 30 Day ITP Review. Adahlia started cardiac rehab on 09/21/23. Devani had poor exercise tolerance. Davyn has since improved and is getting stronger.         Comments: See ITP comments.

## 2023-10-14 ENCOUNTER — Encounter (HOSPITAL_COMMUNITY)
Admission: RE | Admit: 2023-10-14 | Discharge: 2023-10-14 | Disposition: A | Discharge status: Home / Self Care | Source: Ambulatory Visit | Attending: Cardiology

## 2023-10-14 DIAGNOSIS — Z955 Presence of coronary angioplasty implant and graft: Secondary | ICD-10-CM | POA: Diagnosis not present

## 2023-10-19 ENCOUNTER — Encounter (HOSPITAL_COMMUNITY)
Admission: RE | Admit: 2023-10-19 | Discharge: 2023-10-19 | Disposition: A | Discharge status: Home / Self Care | Source: Ambulatory Visit | Attending: Cardiology | Admitting: Cardiology

## 2023-10-19 DIAGNOSIS — Z955 Presence of coronary angioplasty implant and graft: Secondary | ICD-10-CM | POA: Diagnosis not present

## 2023-10-21 ENCOUNTER — Encounter (HOSPITAL_COMMUNITY)
Admission: RE | Admit: 2023-10-21 | Discharge: 2023-10-21 | Disposition: A | Discharge status: Home / Self Care | Source: Ambulatory Visit | Attending: Cardiology

## 2023-10-21 DIAGNOSIS — Z955 Presence of coronary angioplasty implant and graft: Secondary | ICD-10-CM | POA: Diagnosis not present

## 2023-10-26 ENCOUNTER — Encounter (HOSPITAL_COMMUNITY)
Admission: RE | Admit: 2023-10-26 | Discharge: 2023-10-26 | Disposition: A | Discharge status: Home / Self Care | Source: Ambulatory Visit | Attending: Cardiology | Admitting: Cardiology

## 2023-10-26 DIAGNOSIS — Z955 Presence of coronary angioplasty implant and graft: Secondary | ICD-10-CM

## 2023-10-28 ENCOUNTER — Encounter (HOSPITAL_COMMUNITY)
Admission: RE | Admit: 2023-10-28 | Discharge: 2023-10-28 | Disposition: A | Discharge status: Home / Self Care | Source: Ambulatory Visit | Attending: Cardiology | Admitting: Cardiology

## 2023-10-28 DIAGNOSIS — Z955 Presence of coronary angioplasty implant and graft: Secondary | ICD-10-CM

## 2023-11-02 ENCOUNTER — Encounter (HOSPITAL_COMMUNITY)
Admission: RE | Admit: 2023-11-02 | Discharge: 2023-11-02 | Disposition: A | Discharge status: Home / Self Care | Source: Ambulatory Visit | Attending: Cardiology | Admitting: Cardiology

## 2023-11-02 ENCOUNTER — Ambulatory Visit: Payer: Self-pay | Admitting: Cardiology

## 2023-11-02 DIAGNOSIS — I10 Essential (primary) hypertension: Secondary | ICD-10-CM | POA: Diagnosis not present

## 2023-11-02 DIAGNOSIS — E785 Hyperlipidemia, unspecified: Secondary | ICD-10-CM | POA: Diagnosis present

## 2023-11-02 DIAGNOSIS — E118 Type 2 diabetes mellitus with unspecified complications: Secondary | ICD-10-CM | POA: Diagnosis not present

## 2023-11-02 DIAGNOSIS — Z955 Presence of coronary angioplasty implant and graft: Secondary | ICD-10-CM | POA: Insufficient documentation

## 2023-11-02 LAB — GLUCOSE, CAPILLARY: Glucose-Capillary: 98 mg/dL (ref 70–99)

## 2023-11-04 ENCOUNTER — Encounter (HOSPITAL_COMMUNITY)
Admission: RE | Admit: 2023-11-04 | Discharge: 2023-11-04 | Disposition: A | Discharge status: Home / Self Care | Source: Ambulatory Visit | Attending: Cardiology

## 2023-11-04 DIAGNOSIS — Z955 Presence of coronary angioplasty implant and graft: Secondary | ICD-10-CM

## 2023-11-04 DIAGNOSIS — I10 Essential (primary) hypertension: Secondary | ICD-10-CM | POA: Diagnosis not present

## 2023-11-09 ENCOUNTER — Encounter (HOSPITAL_COMMUNITY)
Admission: RE | Admit: 2023-11-09 | Discharge: 2023-11-09 | Disposition: A | Discharge status: Home / Self Care | Source: Ambulatory Visit | Attending: Cardiology

## 2023-11-09 VITALS — Ht 59.5 in | Wt 143.1 lb

## 2023-11-09 DIAGNOSIS — Z955 Presence of coronary angioplasty implant and graft: Secondary | ICD-10-CM

## 2023-11-09 DIAGNOSIS — I10 Essential (primary) hypertension: Secondary | ICD-10-CM | POA: Diagnosis not present

## 2023-11-10 NOTE — Progress Notes (Signed)
 Cardiac Individual Treatment Plan  Patient Details  Name: Ashley Collier MRN: [id number] Date of Birth: 08-20-1950 Referring Provider:   Flowsheet Row CARDIAC REHAB PHASE II ORIENTATION from 09/15/2023 in [facility]  Referring Provider Fredrica Lipoma, MD [place])  Leldon, Wilbert SAUNDERS, MD (covering)]    Initial Encounter Date:  Flowsheet Row CARDIAC REHAB PHASE II ORIENTATION from 09/15/2023 in [facility]  Date 09/15/23    Visit Diagnosis: 06/30/23 DES LAD, 07/27/23 DES LCx  Patient's Home Medications on Admission:  Current Outpatient Medications:    Acetaminophen  (TYLENOL  EXTRA STRENGTH PO), Take 650 mg by mouth 2 (two) times daily., Disp: , Rfl:    acidophilus (RISAQUAD) CAPS capsule, Take 1 capsule by mouth daily., Disp: , Rfl:    alendronate (FOSAMAX) 70 MG tablet, Take 70 mg by mouth once a week. Take with a full glass of water on an empty stomach., Disp: , Rfl:    aspirin EC 81 MG tablet, Take 81 mg by mouth daily. Swallow whole., Disp: , Rfl:    atorvastatin (LIPITOR) 80 MG tablet, Take 40 mg by mouth at bedtime., Disp: , Rfl:    b complex vitamins tablet, Take 1 tablet by mouth daily., Disp: , Rfl:    calcium carbonate (CALCIUM 600) 600 MG TABS tablet, Take 600 mg by mouth 2 (two) times daily with a meal., Disp: , Rfl:    clopidogrel (PLAVIX) 75 MG tablet, Take 75 mg by mouth daily., Disp: , Rfl:    cyanocobalamin (VITAMIN B12) 250 MCG tablet, Take 250 mcg by mouth daily., Disp: , Rfl:    ferrous gluconate (FERGON) 324 MG tablet, Take 324 mg by mouth daily with breakfast., Disp: , Rfl:    fluticasone (FLONASE) 50 MCG/ACT nasal spray, Place 1 spray into both nostrils daily. , Disp: , Rfl:    furosemide (LASIX) 20 MG tablet, Take 20 mg by mouth every 3 (three) days., Disp: , Rfl:    lisinopril (PRINIVIL,ZESTRIL) 10 MG tablet, Take 5 mg by mouth daily., Disp: , Rfl:     loratadine (CLARITIN) 10 MG tablet, Take 10 mg by mouth daily., Disp: , Rfl:    metFORMIN (GLUCOPHAGE) 1000 MG tablet, Take 500 mg by mouth 2 (two) times daily with a meal., Disp: , Rfl:    metoprolol succinate (TOPROL-XL) 25 MG 24 hr tablet, Take 25 mg by mouth daily. Hold if SBP<100 or HR<60, Disp: , Rfl:    montelukast (SINGULAIR) 10 MG tablet, Take 10 mg by mouth at bedtime., Disp: , Rfl:    Multiple Vitamin (MULTIVITAMIN) capsule, Take 1 capsule by mouth daily., Disp: , Rfl:    pantoprazole (PROTONIX) 40 MG tablet, Take 40 mg by mouth daily., Disp: , Rfl:    spironolactone (ALDACTONE) 25 MG tablet, Take 12.5 mg by mouth daily. Hold if SBP<110, Disp: , Rfl:    traZODone (DESYREL) 50 MG tablet, Take 50 mg by mouth at bedtime. Take 1/2-1 tablet nightly prn, Disp: , Rfl:    valACYclovir (VALTREX) 1000 MG tablet, Take 1,000 mg by mouth 2 (two) times daily as needed (cold sores). 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 with out break occurs   Pure hypercholesterolemia    Reflux esophagitis    Screening for other eye conditions    Type II or unspecified type diabetes mellitus without mention of complication, not stated as uncontrolled     Tobacco Use: Social History   Tobacco Use  Smoking Status Never  Smokeless Tobacco Never    Labs: Review Flowsheet       Latest Ref Rng & Units 02/08/2016  Labs for ITP Cardiac and Pulmonary Rehab  Hemoglobin A1c 4.8 - 5.6 % 6.5     Capillary Blood Glucose: Lab Results  Component Value Date   GLUCAP 98 11/02/2023   GLUCAP 108 (H) 09/23/2023   GLUCAP  128 (H) 09/23/2023   GLUCAP 102 (H) 09/21/2023   GLUCAP 100 (H) 09/21/2023     Exercise Target Goals: Exercise Program Goal: Individual exercise prescription set using results from initial 6 min walk test and THRR while considering  patient's activity barriers and safety.   Exercise Prescription Goal: Initial exercise prescription builds to 30-45 minutes a day of aerobic activity, 2-3 days per week.  Home exercise guidelines will be given to patient during program as part of exercise prescription that the participant will acknowledge.  Activity Barriers & Risk Stratification:  Activity Barriers & Cardiac Risk Stratification - 09/15/23 0939       Activity Barriers & Cardiac Risk Stratification   Activity Barriers Arthritis;Back Problems;History of Falls;Shortness of Breath;Deconditioning    Cardiac Risk Stratification High          6 Minute Walk:  6 Minute Walk     Row Name 09/15/23 1014 11/09/23 0920       6 Minute Walk   Phase Initial Discharge    Distance 691 feet 1125 feet    Distance % Change -- 62.8 %    Distance Feet Change -- 434 ft    Walk Time 4.58 minutes 6 minutes    # of Rest Breaks 1  Seated rest at 4 mins 35 seconds til end of test. 0    MPH 1.71 2.13    METS 1.62 2.3    RPE 15 13    Perceived Dyspnea  2 0    VO2 Peak 5.66 8.1    Symptoms Yes (comment) No    Comments Shortness of breath, dizzy, lightheaded. --    Resting HR 60 bpm 74 bpm    Resting BP 152/62 144/78    Resting Oxygen Saturation  99 % --    Exercise Oxygen Saturation  during 6 min walk 100 % --    Max Ex. HR 78 bpm 80 bpm    Max Ex. BP 162/70 156/72    2 Minute Post BP 142/62 --       Oxygen Initial Assessment:   Oxygen Re-Evaluation:   Oxygen Discharge (Final Oxygen Re-Evaluation):   Initial Exercise Prescription:  Initial Exercise Prescription - 09/15/23 1300       Date of Initial Exercise RX and Referring Provider   Date 09/15/23    Referring Provider Fredrica Lipoma, MD ([facility])   Shlomo Wilbert SAUNDERS, MD (covering)   Expected Discharge Date 11/16/23      NuStep   Level 1    SPM 75    Minutes 30  METs 1.6      Prescription Details   Frequency (times per week) 2    Duration Progress to 30 minutes of continuous aerobic without signs/symptoms of physical distress      Intensity   THRR 40-80% of Max Heartrate 60-119    Ratings of Perceived Exertion 11-13    Perceived Dyspnea 0-4      Progression   Progression Continue to progress workloads to maintain intensity without signs/symptoms of physical distress.      Resistance Training   Training Prescription Yes    Weight 2 lbs    Reps 10-15          Perform Capillary Blood Glucose checks as needed.  Exercise Prescription Changes:   Exercise Prescription Changes     Row Name 09/21/23 1000 10/05/23 1030 10/26/23 1030         Response to Exercise   Blood Pressure (Admit) 130/70 122/72 142/58     Blood Pressure (Exercise) 124/82 164/70 --     Blood Pressure (Exit) 132/80 146/68 132/64     Heart Rate (Admit) 66 bpm 61 bpm 69 bpm     Heart Rate (Exercise) 75 bpm 69 bpm 89 bpm     Heart Rate (Exit) 77 bpm 67 bpm 66 bpm     Rating of Perceived Exertion (Exercise) 13 13 14      Symptoms Lightheaedness and fatigue None None     Comments Pt's first day in the CRP2 program Reviewed METs Reviewed METs and goals     Duration Progress to 30 minutes of  aerobic without signs/symptoms of physical distress Continue with 30 min of aerobic exercise without signs/symptoms of physical distress. Continue with 30 min of aerobic exercise without signs/symptoms of physical distress.     Intensity THRR unchanged THRR unchanged THRR unchanged       Progression   Progression Continue to progress workloads to maintain intensity without signs/symptoms of physical distress. Continue to progress workloads to maintain intensity without signs/symptoms of physical distress. Continue to progress workloads to maintain  intensity without signs/symptoms of physical distress.     Average METs 1.6 2.1 --       Resistance Training   Training Prescription No Yes Yes     Weight Weights held today 2 lbs 2 lbs     Reps -- 10-15 10-15     Time -- 5 Minutes 5 Minutes       Interval Training   Interval Training No No No       NuStep   Level 1 2 2      SPM 67 74 --     Minutes 10 30 30      METs 1.6 2.1 --  No data        Exercise Comments:   Exercise Comments     Row Name 09/21/23 1102 10/08/23 0905 10/26/23 1030       Exercise Comments Pt has poor exercise tolerance. Pt was only able to tolerate 10 minutes on Nsutep. Pt had lightheadedness during warm-up and Nustep. Pt was assessed by RN. Pt will require slow progression. Warm-up will be done from the chair for energy conservation. See RN note in EPIC. Pt has made excellent progress in the 2 weeks since satring the program. Pt's has increased he exercise time to 30 minutes and increased her MET level. Pt reports feeling better since starting the program. Pt continues to make good progress and pt reports how the exercise has imrpoved the quality of her  life and allowed her to get back into doing things around the house.        Exercise Goals and Review:   Exercise Goals     Row Name 09/15/23 0940             Exercise Goals   Increase Physical Activity Yes       Intervention Provide advice, education, support and counseling about physical activity/exercise needs.;Develop an individualized exercise prescription for aerobic and resistive training based on initial evaluation findings, risk stratification, comorbidities and participant's personal goals.       Expected Outcomes Short Term: Attend rehab on a regular basis to increase amount of physical activity.;Long Term: Exercising regularly at least 3-5 days a week.;Long Term: Add in home exercise to make exercise part of routine and to increase amount of physical activity.       Increase Strength and  Stamina Yes       Intervention Provide advice, education, support and counseling about physical activity/exercise needs.;Develop an individualized exercise prescription for aerobic and resistive training based on initial evaluation findings, risk stratification, comorbidities and participant's personal goals.       Expected Outcomes Short Term: Increase workloads from initial exercise prescription for resistance, speed, and METs.;Short Term: Perform resistance training exercises routinely during rehab and add in resistance training at home;Long Term: Improve cardiorespiratory fitness, muscular endurance and strength as measured by increased METs and functional capacity ( )       Able to understand and use rate of perceived exertion (RPE) scale Yes       Intervention Provide education and explanation on how to use RPE scale       Expected Outcomes Short Term: Able to use RPE daily in rehab to express subjective intensity level;Long Term:  Able to use RPE to guide intensity level when exercising independently       Knowledge and understanding of Target Heart Rate Range (THRR) Yes       Intervention Provide education and explanation of THRR including how the numbers were predicted and where they are located for reference       Expected Outcomes Short Term: Able to state/look up THRR;Long Term: Able to use THRR to govern intensity when exercising independently;Short Term: Able to use daily as guideline for intensity in rehab       Understanding of Exercise Prescription Yes       Intervention Provide education, explanation, and written materials on patient's individual exercise prescription       Expected Outcomes Short Term: Able to explain program exercise prescription;Long Term: Able to explain home exercise prescription to exercise independently          Exercise Goals Re-Evaluation :  Exercise Goals Re-Evaluation     Row Name 09/21/23 1101 10/26/23 1015           Exercise Goal Re-Evaluation    Exercise Goals Review Increase Physical Activity;Understanding of Exercise Prescription;Increase Strength and Stamina;Knowledge and understanding of Target Heart Rate Range (THRR);Able to understand and use rate of perceived exertion (RPE) scale Increase Physical Activity;Understanding of Exercise Prescription;Increase Strength and Stamina;Knowledge and understanding of Target Heart Rate Range (THRR);Able to understand and use rate of perceived exertion (RPE) scale      Comments Pt's first day in the CRP2 program. she understands the exercise Rx, RPE scale and THRR. Reviewed METs and goals with patient today. Pt is mkaing progress on her goal of walking everydat wtihout SOB. Thi has improved greatly per patient. She is walking  10-15 minutes 2x/day. Pt voices that she feels much better, so much so that she vaccumed the house; someting she has been unable to do for months.      Expected Outcomes Will continue to montior patient and progress exercise workloads as tolerated. Will continue to montior patient and progress exercise workloads as tolerated.         Discharge Exercise Prescription (Final Exercise Prescription Changes):  Exercise Prescription Changes - 10/26/23 1030       Response to Exercise   Blood Pressure (Admit) 142/58    Blood Pressure (Exit) 132/64    Heart Rate (Admit) 69 bpm    Heart Rate (Exercise) 89 bpm    Heart Rate (Exit) 66 bpm    Rating of Perceived Exertion (Exercise) 14    Symptoms None    Comments Reviewed METs and goals    Duration Continue with 30 min of aerobic exercise without signs/symptoms of physical distress.    Intensity THRR unchanged      Progression   Progression Continue to progress workloads to maintain intensity without signs/symptoms of physical distress.      Resistance Training   Training Prescription Yes    Weight 2 lbs    Reps 10-15    Time 5 Minutes      Interval Training   Interval Training No      NuStep   Level 2    Minutes 30     METs --   No data         Nutrition:  Target Goals: Understanding of nutrition guidelines, daily intake of sodium 1500mg , cholesterol 200mg , calories 30% from fat and 7% or less from saturated fats, daily to have 5 or more servings of fruits and vegetables.  Biometrics:  Pre Biometrics - 09/15/23 0820       Pre Biometrics   Waist Circumference 39.5 inches    Hip Circumference 42 inches    Waist to Hip Ratio 0.94 %    Triceps Skinfold 19 mm    % Body Fat 39.3 %    Grip Strength 8 kg    Flexibility 12.13 in    Single Leg Stand 11.87 seconds          Post Biometrics - 11/09/23 0930        Post  Biometrics   Height 4' 11.5 (1.511 m)    Weight 64.9 kg    Waist Circumference 41 inches    Hip Circumference 43 inches    Waist to Hip Ratio 0.95 %    BMI (Calculated) 28.43    Triceps Skinfold 19 mm    % Body Fat 40.5 %    Grip Strength 16 kg    Flexibility 16.5 in    Single Leg Stand 6.12 seconds          Nutrition Therapy Plan and Nutrition Goals:  Nutrition Therapy & Goals - 10/16/23 1107       Nutrition Therapy   Diet Heart Healthy Diet      Personal Nutrition Goals   Nutrition Goal Patient to identify strategies for reducing cardiovascular risk by attending the [employer] education and nutrition series weekly.   goal in progress.   Personal Goal #2 Patient to improve diet quality by using the plate method as a guide for meal planning to include lean protein/plant protein, fruits, vegetables, whole grains, nonfat dairy as part of a well-balanced diet.   goal in progress.   Comments Goals in progress. Patient has medical history  of HTN, Hyperlipidemia, DM2, CAD, congestive heart failure. 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 She continues to follow a high protein diet and reports adequate restriction following bariatric surgery.  A1c is in a normal range; she continues metformin. Lipids are at goal. We have discussed protein  sources/protein goals as well. She is up 5.5# since starting with our program. Patient will benefit from participation in intensive cardiac rehab for nutrition, exercise, and lifestyle modification.      Intervention Plan   Intervention Prescribe, educate and counsel regarding individualized specific dietary modifications aiming towards targeted core components such as weight, hypertension, lipid management, diabetes, heart failure and other comorbidities.;Nutrition handout(s) given to patient.    Expected Outcomes Short Term Goal: Understand basic principles of dietary content, such as calories, fat, sodium, cholesterol and nutrients.;Long Term Goal: Adherence to prescribed nutrition plan.          Nutrition Assessments:  Nutrition Assessments - 09/24/23 1417       Rate Your Plate Scores   Pre Score 62         MEDIFICTS Score Key: >=70 Need to make dietary changes  40-70 Heart Healthy Diet <= 40 Therapeutic Level Cholesterol Diet   Flowsheet Row CARDIAC REHAB PHASE II EXERCISE from 11/09/2023 in [facility]  Picture Your Plate Total Score on Discharge 70   Picture Your Plate Scores: <59 Unhealthy dietary pattern with much room for improvement. 41-50 Dietary pattern unlikely to meet recommendations for good health and room for improvement. 51-60 More healthful dietary pattern, with some room for improvement.  >60 Healthy dietary pattern, although there may be some specific behaviors that could be improved.    Nutrition Goals Re-Evaluation:  Nutrition Goals Re-Evaluation     Row Name 09/21/23 1024 10/16/23 1107           Goals   Current Weight 139 lb 12.4 oz (63.4 kg) 141 lb 12.1 oz (64.3 kg)      Comment A1c 5.5, LDL 20, HDL 54 A1c 5.5, LDL 20, HDL 54      Expected Outcome Patient has medical history of HTN, Hyperlipidemia, DM2, CAD, congestive heart failure. A1c is in a normal range; she continues metformin.  Lipids are at goal. Patient will benefit from participation in intensive cardiac rehab for nutrition, exercise, and lifestyle modification. Goals in progress. Patient has medical history of HTN, Hyperlipidemia, DM2, CAD, congestive heart failure. 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 She continues to follow a high protein diet and reports adequate restriction following bariatric surgery. A1c is in a normal range; she continues metformin. Lipids are at goal. We have discussed protein sources/protein goals as well. She is up 5.5# since starting with our program. Patient will benefit from participation in intensive cardiac rehab for nutrition, exercise, and lifestyle modification.         Nutrition Goals Re-Evaluation:  Nutrition Goals Re-Evaluation     Row Name 09/21/23 1024 10/16/23 1107           Goals   Current Weight 139 lb 12.4 oz (63.4 kg) 141 lb 12.1 oz (64.3 kg)      Comment A1c 5.5, LDL 20, HDL 54 A1c 5.5, LDL 20, HDL 54      Expected Outcome Patient has medical history of HTN, Hyperlipidemia, DM2, CAD, congestive heart failure. A1c is in a normal range; she continues metformin. Lipids are at goal.  Patient will benefit from participation in intensive cardiac rehab for nutrition, exercise, and lifestyle modification. Goals in progress. Patient has medical history of HTN, Hyperlipidemia, DM2, CAD, congestive heart failure. 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 She continues to follow a high protein diet and reports adequate restriction following bariatric surgery. A1c is in a normal range; she continues metformin. Lipids are at goal. We have discussed protein sources/protein goals as well. She is up 5.5# since starting with our program. Patient will benefit from participation in intensive cardiac rehab for nutrition, exercise, and lifestyle modification.         Nutrition Goals Discharge (Final Nutrition Goals  Re-Evaluation):  Nutrition Goals Re-Evaluation - 10/16/23 1107       Goals   Current Weight 141 lb 12.1 oz (64.3 kg)    Comment A1c 5.5, LDL 20, HDL 54    Expected Outcome Goals in progress. Patient has medical history of HTN, Hyperlipidemia, DM2, CAD, congestive heart failure. 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 She continues to follow a high protein diet and reports adequate restriction following bariatric surgery. A1c is in a normal range; she continues metformin. Lipids are at goal. We have discussed protein sources/protein goals as well. She is up 5.5# since starting with our program. Patient will benefit from participation in intensive cardiac rehab for nutrition, exercise, and lifestyle modification.          Psychosocial: Target Goals: Acknowledge presence or absence of significant depression and/or stress, maximize coping skills, provide positive support system. Participant is able to verbalize types and ability to use techniques and skills needed for reducing stress and depression.  Initial Review & Psychosocial Screening:  Initial Psych Review & Screening - 09/15/23 0949       Initial Review   Current issues with Current Stress Concerns    Source of Stress Concerns Financial    Comments Does breathing exercises / calming exercises if stress level is elevated.      Family Dynamics   Good Support System? Yes    Comments Ashley Collier has support from her husband and sister who she lives with.      Barriers   Psychosocial barriers to participate in program The patient should benefit from training in stress management and relaxation.;Psychosocial barriers identified (see note)      Screening Interventions   Interventions Encouraged to exercise;To provide support and resources with identified psychosocial needs;Provide feedback about the scores to participant    Expected Outcomes Short Term goal: Utilizing psychosocial counselor, staff and  physician to assist with identification of specific Stressors or current issues interfering with healing process. Setting desired goal for each stressor or current issue identified.;Long Term Goal: Stressors or current issues are controlled or eliminated.;Short Term goal: Identification and review with participant of any Quality of Life or Depression concerns found by scoring the questionnaire.;Long Term goal: The participant improves quality of Life and PHQ9 Scores as seen by post scores and/or verbalization of changes          Quality of Life Scores:  Quality of Life - 11/10/23 0851       Quality of Life Scores   Health/Function Pre 21.93 %    Health/Function Post 26.6 %    Health/Function % Change 21.3 %    Socioeconomic Pre 29.64 %    Socioeconomic Post 27.43 %    Socioeconomic % Change  -7.46 %    Psych/Spiritual Pre 26.57 %  Psych/Spiritual Post 30 %    Psych/Spiritual % Change 12.91 %    Family Pre 30 %    Family Post 26.4 %    Family % Change -12 %    GLOBAL Pre 25.66 %    GLOBAL Post 27.44 %    GLOBAL % Change 6.94 %         Scores of 19 and below usually indicate a poorer quality of life in these areas.  A difference of  2-3 points is a clinically meaningful difference.  A difference of 2-3 points in the total score of the Quality of Life Index has been associated with significant improvement in overall quality of life, self-image, physical symptoms, and general health in studies assessing change in quality of life.  PHQ-9: Review Flowsheet       09/15/2023  Depression screen PHQ 2/9  Decreased Interest 0  Down, Depressed, Hopeless 0  PHQ - 2 Score 0  Altered sleeping 1  Tired, decreased energy 1  Change in appetite 1  Feeling bad or failure about yourself  0  Trouble concentrating 1  Moving slowly or fidgety/restless 1  Suicidal thoughts 0  PHQ-9 Score 5  Difficult doing work/chores Somewhat difficult   Interpretation of Total Score  Total Score  Depression Severity:  1-4 = Minimal depression, 5-9 = Mild depression, 10-14 = Moderate depression, 15-19 = Moderately severe depression, 20-27 = Severe depression   Psychosocial Evaluation and Intervention:   Psychosocial Re-Evaluation:  Psychosocial Re-Evaluation     Row Name 09/29/23 0855 10/01/23 1729 11/10/23 1742         Psychosocial Re-Evaluation   Current issues with Current Stress Concerns Current Stress Concerns None Identified     Comments Ashley Collier has not voiced any increased concerns or stressors since starting cardiac rehab. Will review  PHQ2-9 in the upcoming week PHQ2-9 reviewed. Ashley Collier says she feels much better and has more energy. Ashley Collier says she feels like a switch has been turned on for her! Ashley Collier has not voiced any increased concerns or stressors during exercise at cardiac rehab     Expected Outcomes Ashley Collier will have controlled or decreased stressors upon completion of cardiac rehab Ashley Collier will have controlled or decreased stressors upon completion of cardiac rehab Ashley Collier will have controlled or decreased stressors upon completion of cardiac rehab     Interventions Stress management education;Encouraged to attend Cardiac Rehabilitation for the exercise;Relaxation education Stress management education;Encouraged to attend Cardiac Rehabilitation for the exercise;Relaxation education Stress management education;Encouraged to attend Cardiac Rehabilitation for the exercise;Relaxation education     Continue Psychosocial Services  Follow up required by staff No Follow up required No Follow up required       Initial Review   Source of Stress Concerns Financial Financial Financial     Comments Will continue to montior and offer support as needed Will continue to montior and offer support as needed Will continue to montior and offer support as needed        Psychosocial Discharge (Final Psychosocial Re-Evaluation):  Psychosocial Re-Evaluation - 11/10/23 1742       Psychosocial  Re-Evaluation   Current issues with None Identified    Comments Ashley Collier has not voiced any increased concerns or stressors during exercise at cardiac rehab    Expected Outcomes Ashley Collier will have controlled or decreased stressors upon completion of cardiac rehab    Interventions Stress management education;Encouraged to attend Cardiac Rehabilitation for the exercise;Relaxation education    Continue Psychosocial Services  No Follow  up required      Initial Review   Source of Stress Concerns Financial    Comments Will continue to montior and offer support as needed          Vocational Rehabilitation: Provide vocational rehab assistance to qualifying candidates.   Vocational Rehab Evaluation & Intervention:  Vocational Rehab - 09/15/23 9047       Initial Vocational Rehab Evaluation & Intervention   Assessment shows need for Vocational Rehabilitation No      Vocational Rehab Re-Evaulation   Comments Ashley Collier is retired and has no VR needs at this time.          Education: Education Goals: Education classes will be provided on a weekly basis, covering required topics. Participant will state understanding/return demonstration of topics presented.    Education     Row Name 09/21/23 0800     Education   Cardiac Education Topics Pritikin   Select Core Videos     Core Videos   Educator Exercise Physiologist   Select Exercise Education   Exercise Education Biomechanial Limitations   Instruction Review Code 1- Verbalizes Understanding   Class Start Time 0815   Class Stop Time 0847   Class Time Calculation (min) 32 min    Row Name 09/23/23 1000     Education   Cardiac Education Topics Pritikin   Orthoptist   Educator Dietitian   Weekly Topic Fast Evening Meals   Instruction Review Code 1- Verbalizes Understanding   Class Start Time 0815   Class Stop Time 0856   Class Time Calculation (min) 41 min    Row Name 09/28/23 0900     Education    Cardiac Education Topics Pritikin   Glass blower/designer Nutrition   Nutrition Workshop Fueling a Forensic psychologist   Instruction Review Code 1- Verbalizes Understanding   Class Start Time 0815   Class Stop Time 0855   Class Time Calculation (min) 40 min    Row Name 09/30/23 0900     Education   Cardiac Education Topics Pritikin   Customer service manager   Weekly Topic International Cuisine- Spotlight on the [facility] Zones   Instruction Review Code 1- Verbalizes Understanding   Class Start Time 0815   Class Stop Time 0850   Class Time Calculation (min) 35 min    Row Name 10/05/23 0800     Education   Cardiac Education Topics Pritikin   Select Workshops     Workshops   Educator Exercise Physiologist   Select Psychosocial   Psychosocial Workshop Healthy Sleep for a Healthy Heart   Instruction Review Code 1- Verbalizes Understanding   Class Start Time 0815   Class Stop Time 0900   Class Time Calculation (min) 45 min    Row Name 10/07/23 0900     Education   Cardiac Education Topics Pritikin   Secondary school teacher School   Educator Dietitian   Weekly Topic Simple Sides and Sauces   Instruction Review Code 1- Verbalizes Understanding   Class Start Time 0815   Class Stop Time 0846   Class Time Calculation (min) 31 min    Row Name 10/12/23 0800     Education   Cardiac Education Topics Pritikin     Pension scheme manager  General Education   General Education Hypertension and Heart Disease   Instruction Review Code 1- Verbalizes Understanding   Class Start Time 0815   Class Stop Time 0851   Class Time Calculation (min) 36 min    Row Name 10/14/23 0900     Education   Cardiac Education Topics Pritikin   Secondary school teacher School   Educator Dietitian   Weekly Topic Powerhouse Plant-Based Proteins   Instruction Review Code 1-  Verbalizes Understanding   Class Start Time 0815   Class Stop Time 0850   Class Time Calculation (min) 35 min    Row Name 10/19/23 0900     Education   Cardiac Education Topics Pritikin   Select Workshops     Workshops   Educator Exercise Physiologist   Select Psychosocial   Psychosocial Workshop From Head to Heart: The Power of a Healthy Outlook   Instruction Review Code 1- Verbalizes Understanding   Class Start Time 0815   Class Stop Time 0853   Class Time Calculation (min) 38 min    Row Name 10/21/23 0800     Education   Cardiac Education Topics Pritikin   Secondary school teacher School   Educator Dietitian   Weekly Topic Tasty Appetizers and Snacks   Instruction Review Code 1- Verbalizes Understanding   Class Start Time 0815   Class Stop Time 0856   Class Time Calculation (min) 41 min    Row Name 10/26/23 0800     Education   Cardiac Education Topics Pritikin   Select Core Videos     Core Videos   Educator Exercise Physiologist   Select Psychosocial   Psychosocial Healthy Minds, Bodies, Hearts   Instruction Review Code 1- Verbalizes Understanding   Class Start Time 0815   Class Stop Time 0850   Class Time Calculation (min) 35 min    Row Name 10/28/23 0800     Education   Cardiac Education Topics Pritikin   Secondary school teacher School   Educator Dietitian   Weekly Topic Adding Flavor - Sodium-Free   Instruction Review Code 1- Verbalizes Understanding   Class Start Time 0815   Class Stop Time 0850   Class Time Calculation (min) 35 min    Row Name 11/02/23 0800     Education   Cardiac Education Topics Pritikin   Glass blower/designer Nutrition   Nutrition Workshop Label Reading   Instruction Review Code 1- Verbalizes Understanding   Class Start Time 0815   Class Stop Time 0910   Class Time Calculation (min) 55 min    Row Name 11/04/23 0800     Education   Cardiac Education  Topics Pritikin   Select Core Videos     Core Videos   Educator Exercise Physiologist   Select Nutrition   Nutrition Other  Label reading   Instruction Review Code 1- Verbalizes Understanding   Class Start Time 0815   Class Stop Time 0847   Class Time Calculation (min) 32 min    Row Name 11/09/23 0800     Education   Cardiac Education Topics Pritikin   Hospital doctor Education   General Education Metabolic Syndrome and Belly Fat   Instruction Review Code 1- Verbalizes Understanding   Class Start Time  9182   Class Stop Time 0900   Class Time Calculation (min) 43 min      Core Videos: Exercise    Move It!  Clinical staff conducted group or individual video education with verbal and written material and guidebook.  Patient learns the recommended Pritikin exercise program. Exercise with the goal of living a long, healthy life. Some of the health benefits of exercise include controlled diabetes, healthier blood pressure levels, improved cholesterol levels, improved heart and lung capacity, improved sleep, and better body composition. Everyone should speak with their doctor before starting or changing an exercise routine.  Biomechanical Limitations Clinical staff conducted group or individual video education with verbal and written material and guidebook.  Patient learns how biomechanical limitations can impact exercise and how we can mitigate and possibly overcome limitations to have an impactful and balanced exercise routine.  Body Composition Clinical staff conducted group or individual video education with verbal and written material and guidebook.  Patient learns that body composition (ratio of muscle mass to fat mass) is a key component to assessing overall fitness, rather than body weight alone. Increased fat mass, especially visceral belly fat, can put us  at increased risk for metabolic syndrome, type 2  diabetes, heart disease, and even death. It is recommended to combine diet and exercise (cardiovascular and resistance training) to improve your body composition. Seek guidance from your physician and exercise physiologist before implementing an exercise routine.  Exercise Action Plan Clinical staff conducted group or individual video education with verbal and written material and guidebook.  Patient learns the recommended strategies to achieve and enjoy long-term exercise adherence, including variety, self-motivation, self-efficacy, and positive decision making. Benefits of exercise include fitness, good health, weight management, more energy, better sleep, less stress, and overall well-being.  Medical   Heart Disease Risk Reduction Clinical staff conducted group or individual video education with verbal and written material and guidebook.  Patient learns our heart is our most vital organ as it circulates oxygen, nutrients, white blood cells, and hormones throughout the entire body, and carries waste away. Data supports a plant-based eating plan like the Pritikin Program for its effectiveness in slowing progression of and reversing heart disease. The video provides a number of recommendations to address heart disease.   Metabolic Syndrome and Belly Fat  Clinical staff conducted group or individual video education with verbal and written material and guidebook.  Patient learns what metabolic syndrome is, how it leads to heart disease, and how one can reverse it and keep it from coming back. You have metabolic syndrome if you have 3 of the following 5 criteria: abdominal obesity, high blood pressure, high triglycerides, low HDL cholesterol, and high blood sugar.  Hypertension and Heart Disease Clinical staff conducted group or individual video education with verbal and written material and guidebook.  Patient learns that high blood pressure, or hypertension, is very common in the [place] .  Hypertension is largely due to excessive salt intake, but other important risk factors include being overweight, physical inactivity, drinking too much alcohol, smoking, and not eating enough potassium from fruits and vegetables. High blood pressure is a leading risk factor for heart attack, stroke, congestive heart failure, dementia, kidney failure, and premature death. Long-term effects of excessive salt intake include stiffening of the arteries and thickening of heart muscle and organ damage. Recommendations include ways to reduce hypertension and the risk of heart disease.  Diseases of Our Time - Focusing on Diabetes Clinical staff conducted group or individual video education  with verbal and written material and guidebook.  Patient learns why the best way to stop diseases of our time is prevention, through food and other lifestyle changes. Medicine (such as prescription pills and surgeries) is often only a Band-Aid on the problem, not a long-term solution. Most common diseases of our time include obesity, type 2 diabetes, hypertension, heart disease, and cancer. The Pritikin Program is recommended and has been proven to help reduce, reverse, and/or prevent the damaging effects of metabolic syndrome.  Nutrition   Overview of the Pritikin Eating Plan  Clinical staff conducted group or individual video education with verbal and written material and guidebook.  Patient learns about the Pritikin Eating Plan for disease risk reduction. The Pritikin Eating Plan emphasizes a wide variety of unrefined, minimally-processed carbohydrates, like fruits, vegetables, whole grains, and legumes. Go, Caution, and Stop food choices are explained. Plant-based and lean animal proteins are emphasized. Rationale provided for low sodium intake for blood pressure control, low added sugars for blood sugar stabilization, and low added fats and oils for coronary artery disease risk reduction and weight management.  Calorie  Density  Clinical staff conducted group or individual video education with verbal and written material and guidebook.  Patient learns about calorie density and how it impacts the Pritikin Eating Plan. Knowing the characteristics of the food you choose will help you decide whether those foods will lead to weight gain or weight loss, and whether you want to consume more or less of them. Weight loss is usually a side effect of the Pritikin Eating Plan because of its focus on low calorie-dense foods.  Label Reading  Clinical staff conducted group or individual video education with verbal and written material and guidebook.  Patient learns about the Pritikin recommended label reading guidelines and corresponding recommendations regarding calorie density, added sugars, sodium content, and whole grains.  Dining Out - Part 1  Clinical staff conducted group or individual video education with verbal and written material and guidebook.  Patient learns that restaurant meals can be sabotaging because they can be so high in calories, fat, sodium, and/or sugar. Patient learns recommended strategies on how to positively address this and avoid unhealthy pitfalls.  Facts on Fats  Clinical staff conducted group or individual video education with verbal and written material and guidebook.  Patient learns that lifestyle modifications can be just as effective, if not more so, as many medications for lowering your risk of heart disease. A Pritikin lifestyle can help to reduce your risk of inflammation and atherosclerosis (cholesterol build-up, or plaque, in the artery walls). Lifestyle interventions such as dietary choices and physical activity address the cause of atherosclerosis. A review of the types of fats and their impact on blood cholesterol levels, along with dietary recommendations to reduce fat intake is also included.  Nutrition Action Plan  Clinical staff conducted group or individual video education with  verbal and written material and guidebook.  Patient learns how to incorporate Pritikin recommendations into their lifestyle. Recommendations include planning and keeping personal health goals in mind as an important part of their success.  Healthy Mind-Set    Healthy Minds, Bodies, Hearts  Clinical staff conducted group or individual video education with verbal and written material and guidebook.  Patient learns how to identify when they are stressed. Video will discuss the impact of that stress, as well as the many benefits of stress management. Patient will also be introduced to stress management techniques. The way we think, act, and feel has an  impact on our hearts.  How Our Thoughts Can Heal Our Hearts  Clinical staff conducted group or individual video education with verbal and written material and guidebook.  Patient learns that negative thoughts can cause depression and anxiety. This can result in negative lifestyle behavior and serious health problems. Cognitive behavioral therapy is an effective method to help control our thoughts in order to change and improve our emotional outlook.  Additional Videos:  Exercise    Improving Performance  Clinical staff conducted group or individual video education with verbal and written material and guidebook.  Patient learns to use a non-linear approach by alternating intensity levels and lengths of time spent exercising to help burn more calories and lose more body fat. Cardiovascular exercise helps improve heart health, metabolism, hormonal balance, blood sugar control, and recovery from fatigue. Resistance training improves strength, endurance, balance, coordination, reaction time, metabolism, and muscle mass. Flexibility exercise improves circulation, posture, and balance. Seek guidance from your physician and exercise physiologist before implementing an exercise routine and learn your capabilities and proper form for all exercise.  Introduction  to Yoga  Clinical staff conducted group or individual video education with verbal and written material and guidebook.  Patient learns about yoga, a discipline of the coming together of mind, breath, and body. The benefits of yoga include improved flexibility, improved range of motion, better posture and core strength, increased lung function, weight loss, and positive self-image. Yoga's heart health benefits include lowered blood pressure, healthier heart rate, decreased cholesterol and triglyceride levels, improved immune function, and reduced stress. Seek guidance from your physician and exercise physiologist before implementing an exercise routine and learn your capabilities and proper form for all exercise.  Medical   Aging: Enhancing Your Quality of Life  Clinical staff conducted group or individual video education with verbal and written material and guidebook.  Patient learns key strategies and recommendations to stay in good physical health and enhance quality of life, such as prevention strategies, having an advocate, securing a Health Care Proxy and Power of Attorney, and keeping a list of medications and system for tracking them. It also discusses how to avoid risk for bone loss.  Biology of Weight Control  Clinical staff conducted group or individual video education with verbal and written material and guidebook.  Patient learns that weight gain occurs because we consume more calories than we burn (eating more, moving less). Even if your body weight is normal, you may have higher ratios of fat compared to muscle mass. Too much body fat puts you at increased risk for cardiovascular disease, heart attack, stroke, type 2 diabetes, and obesity-related cancers. In addition to exercise, following the Pritikin Eating Plan can help reduce your risk.  Decoding Lab Results  Clinical staff conducted group or individual video education with verbal and written material and guidebook.  Patient learns  that lab test reflects one measurement whose values change over time and are influenced by many factors, including medication, stress, sleep, exercise, food, hydration, pre-existing medical conditions, and more. It is recommended to use the knowledge from this video to become more involved with your lab results and evaluate your numbers to speak with your doctor.   Diseases of Our Time - Overview  Clinical staff conducted group or individual video education with verbal and written material and guidebook.  Patient learns that according to the CDC, 50% to 70% of chronic diseases (such as obesity, type 2 diabetes, elevated lipids, hypertension, and heart disease) are avoidable through lifestyle improvements including  healthier food choices, listening to satiety cues, and increased physical activity.  Sleep Disorders Clinical staff conducted group or individual video education with verbal and written material and guidebook.  Patient learns how good quality and duration of sleep are important to overall health and well-being. Patient also learns about sleep disorders and how they impact health along with recommendations to address them, including discussing with a physician.  Nutrition  Dining Out - Part 2 Clinical staff conducted group or individual video education with verbal and written material and guidebook.  Patient learns how to plan ahead and communicate in order to maximize their dining experience in a healthy and nutritious manner. Included are recommended food choices based on the type of restaurant the patient is visiting.   Fueling a Banker conducted group or individual video education with verbal and written material and guidebook.  There is a strong connection between our food choices and our health. Diseases like obesity and type 2 diabetes are very prevalent and are in large-part due to lifestyle choices. The Pritikin Eating Plan provides plenty of food and  hunger-curbing satisfaction. It is easy to follow, affordable, and helps reduce health risks.  Menu Workshop  Clinical staff conducted group or individual video education with verbal and written material and guidebook.  Patient learns that restaurant meals can sabotage health goals because they are often packed with calories, fat, sodium, and sugar. Recommendations include strategies to plan ahead and to communicate with the manager, chef, or server to help order a healthier meal.  Planning Your Eating Strategy  Clinical staff conducted group or individual video education with verbal and written material and guidebook.  Patient learns about the Pritikin Eating Plan and its benefit of reducing the risk of disease. The Pritikin Eating Plan does not focus on calories. Instead, it emphasizes high-quality, nutrient-rich foods. By knowing the characteristics of the foods, we choose, we can determine their calorie density and make informed decisions.  Targeting Your Nutrition Priorities  Clinical staff conducted group or individual video education with verbal and written material and guidebook.  Patient learns that lifestyle habits have a tremendous impact on disease risk and progression. This video provides eating and physical activity recommendations based on your personal health goals, such as reducing LDL cholesterol, losing weight, preventing or controlling type 2 diabetes, and reducing high blood pressure.  Vitamins and Minerals  Clinical staff conducted group or individual video education with verbal and written material and guidebook.  Patient learns different ways to obtain key vitamins and minerals, including through a recommended healthy diet. It is important to discuss all supplements you take with your doctor.   Healthy Mind-Set    Smoking Cessation  Clinical staff conducted group or individual video education with verbal and written material and guidebook.  Patient learns that cigarette  smoking and tobacco addiction pose a serious health risk which affects millions of people. Stopping smoking will significantly reduce the risk of heart disease, lung disease, and many forms of cancer. Recommended strategies for quitting are covered, including working with your doctor to develop a successful plan.  Culinary   Becoming a Set designer conducted group or individual video education with verbal and written material and guidebook.  Patient learns that cooking at home can be healthy, cost-effective, quick, and puts them in control. Keys to cooking healthy recipes will include looking at your recipe, assessing your equipment needs, planning ahead, making it simple, choosing cost-effective seasonal ingredients, and limiting the  use of added fats, salts, and sugars.  Cooking - Breakfast and Snacks  Clinical staff conducted group or individual video education with verbal and written material and guidebook.  Patient learns how important breakfast is to satiety and nutrition through the entire day. Recommendations include key foods to eat during breakfast to help stabilize blood sugar levels and to prevent overeating at meals later in the day. Planning ahead is also a key component.  Cooking - Educational psychologist conducted group or individual video education with verbal and written material and guidebook.  Patient learns eating strategies to improve overall health, including an approach to cook more at home. Recommendations include thinking of animal protein as a side on your plate rather than center stage and focusing instead on lower calorie dense options like vegetables, fruits, whole grains, and plant-based proteins, such as beans. Making sauces in large quantities to freeze for later and leaving the skin on your vegetables are also recommended to maximize your experience.  Cooking - Healthy Salads and Dressing Clinical staff conducted group or individual video  education with verbal and written material and guidebook.  Patient learns that vegetables, fruits, whole grains, and legumes are the foundations of the Pritikin Eating Plan. Recommendations include how to incorporate each of these in flavorful and healthy salads, and how to create homemade salad dressings. Proper handling of ingredients is also covered. Cooking - Soups and [employer] - Soups and Desserts Clinical staff conducted group or individual video education with verbal and written material and guidebook.  Patient learns that Pritikin soups and desserts make for easy, nutritious, and delicious snacks and meal components that are low in sodium, fat, sugar, and calorie density, while high in vitamins, minerals, and filling fiber. Recommendations include simple and healthy ideas for soups and desserts.   Overview     The Pritikin Solution Program Overview Clinical staff conducted group or individual video education with verbal and written material and guidebook.  Patient learns that the results of the Pritikin Program have been documented in more than 100 articles published in peer-reviewed journals, and the benefits include reducing risk factors for (and, in some cases, even reversing) high cholesterol, high blood pressure, type 2 diabetes, obesity, and more! An overview of the three key pillars of the Pritikin Program will be covered: eating well, doing regular exercise, and having a healthy mind-set.  WORKSHOPS  Exercise: Exercise Basics: Building Your Action Plan Clinical staff led group instruction and group discussion with PowerPoint presentation and patient guidebook. To enhance the learning environment the use of posters, models and videos may be added. At the conclusion of this workshop, patients will comprehend the difference between physical activity and exercise, as well as the benefits of incorporating both, into their routine. Patients will understand the FITT (Frequency,  Intensity, Time, and Type) principle and how to use it to build an exercise action plan. In addition, safety concerns and other considerations for exercise and cardiac rehab will be addressed by the presenter. The purpose of this lesson is to promote a comprehensive and effective weekly exercise routine in order to improve patients' overall level of fitness.   Managing Heart Disease: Your Path to a Healthier Heart Clinical staff led group instruction and group discussion with PowerPoint presentation and patient guidebook. To enhance the learning environment the use of posters, models and videos may be added.At the conclusion of this workshop, patients will understand the anatomy and physiology of the heart. Additionally, they will understand  how Pritikin's three pillars impact the risk factors, the progression, and the management of heart disease.  The purpose of this lesson is to provide a high-level overview of the heart, heart disease, and how the Pritikin lifestyle positively impacts risk factors.  Exercise Biomechanics Clinical staff led group instruction and group discussion with PowerPoint presentation and patient guidebook. To enhance the learning environment the use of posters, models and videos may be added. Patients will learn how the structural parts of their bodies function and how these functions impact their daily activities, movement, and exercise. Patients will learn how to promote a neutral spine, learn how to manage pain, and identify ways to improve their physical movement in order to promote healthy living. The purpose of this lesson is to expose patients to common physical limitations that impact physical activity. Participants will learn practical ways to adapt and manage aches and pains, and to minimize their effect on regular exercise. Patients will learn how to maintain good posture while sitting, walking, and lifting.  Balance Training and Fall Prevention  Clinical  staff led group instruction and group discussion with PowerPoint presentation and patient guidebook. To enhance the learning environment the use of posters, models and videos may be added. At the conclusion of this workshop, patients will understand the importance of their sensorimotor skills (vision, proprioception, and the vestibular system) in maintaining their ability to balance as they age. Patients will apply a variety of balancing exercises that are appropriate for their current level of function. Patients will understand the common causes for poor balance, possible solutions to these problems, and ways to modify their physical environment in order to minimize their fall risk. The purpose of this lesson is to teach patients about the importance of maintaining balance as they age and ways to minimize their risk of falling.  WORKSHOPS   Nutrition:  Fueling a Ship broker led group instruction and group discussion with PowerPoint presentation and patient guidebook. To enhance the learning environment the use of posters, models and videos may be added. Patients will review the foundational principles of the Pritikin Eating Plan and understand what constitutes a serving size in each of the food groups. Patients will also learn Pritikin-friendly foods that are better choices when away from home and review make-ahead meal and snack options. Calorie density will be reviewed and applied to three nutrition priorities: weight maintenance, weight loss, and weight gain. The purpose of this lesson is to reinforce (in a group setting) the key concepts around what patients are recommended to eat and how to apply these guidelines when away from home by planning and selecting Pritikin-friendly options. Patients will understand how calorie density may be adjusted for different weight management goals.  Mindful Eating  Clinical staff led group instruction and group discussion with PowerPoint  presentation and patient guidebook. To enhance the learning environment the use of posters, models and videos may be added. Patients will briefly review the concepts of the Pritikin Eating Plan and the importance of low-calorie dense foods. The concept of mindful eating will be introduced as well as the importance of paying attention to internal hunger signals. Triggers for non-hunger eating and techniques for dealing with triggers will be explored. The purpose of this lesson is to provide patients with the opportunity to review the basic principles of the Pritikin Eating Plan, discuss the value of eating mindfully and how to measure internal cues of hunger and fullness using the Hunger Scale. Patients will also discuss reasons for non-hunger  eating and learn strategies to use for controlling emotional eating.  Targeting Your Nutrition Priorities Clinical staff led group instruction and group discussion with PowerPoint presentation and patient guidebook. To enhance the learning environment the use of posters, models and videos may be added. Patients will learn how to determine their genetic susceptibility to disease by reviewing their family history. Patients will gain insight into the importance of diet as part of an overall healthy lifestyle in mitigating the impact of genetics and other environmental insults. The purpose of this lesson is to provide patients with the opportunity to assess their personal nutrition priorities by looking at their family history, their own health history and current risk factors. Patients will also be able to discuss ways of prioritizing and modifying the Pritikin Eating Plan for their highest risk areas  Menu  Clinical staff led group instruction and group discussion with PowerPoint presentation and patient guidebook. To enhance the learning environment the use of posters, models and videos may be added. Using menus brought in from [employer], or printed from [employer], patients will apply the Pritikin dining out guidelines that were presented in the [employer] video. Patients will also be able to practice these guidelines in a variety of provided scenarios. The purpose of this lesson is to provide patients with the opportunity to practice hands-on learning of the Pritikin Dining Out guidelines with actual menus and practice scenarios.  Label Reading Clinical staff led group instruction and group discussion with PowerPoint presentation and patient guidebook. To enhance the learning environment the use of posters, models and videos may be added. Patients will review and discuss the Pritikin label reading guidelines presented in Pritikin's Label Reading Educational series video. Using fool labels brought in from local grocery stores and markets, patients will apply the label reading guidelines and determine if the packaged food meet the Pritikin guidelines. The purpose of this lesson is to provide patients with the opportunity to review, discuss, and practice hands-on learning of the Pritikin Label Reading guidelines with actual packaged food labels. Cooking School  Pritikin's [employer] are designed to teach patients ways to prepare quick, simple, and affordable recipes at home. The importance of nutrition's role in chronic disease risk reduction is reflected in its emphasis in the overall Pritikin program. By learning how to prepare essential core Pritikin Eating Plan recipes, patients will increase control over what they eat; be able to customize the flavor of foods without the use of added salt, sugar, or fat; and improve the quality of the food they consume. By learning a set of core recipes which are easily assembled, quickly prepared, and affordable, patients are more likely to prepare more healthy foods at home. These workshops focus on convenient breakfasts, simple entres, side dishes, and desserts which can be prepared with  minimal effort and are consistent with nutrition recommendations for cardiovascular risk reduction. Cooking [employer] are taught by a Armed forces logistics/support/administrative officer (RD) who has been trained by the [employer]. The chef or RD has a clear understanding of the importance of minimizing - if not completely eliminating - added fat, sugar, and sodium in recipes. Throughout the series of Cooking School Workshop sessions, patients will learn about healthy ingredients and efficient methods of cooking to build confidence in their capability to prepare    Cooking School weekly topics:  Adding Flavor- Sodium-Free  Fast and Healthy Breakfasts  Powerhouse Plant-Based Proteins  Satisfying Salads and Dressings  Simple Sides and  Sauces  International Cuisine-Spotlight on the [employer] Zones  Delicious Desserts  Savory Soups  [employer] - Meals in a Astronomer Appetizers and Snacks  Comforting Weekend Breakfasts  One-Pot Wonders   Fast Evening Meals  Landscape architect Your Pritikin Plate  WORKSHOPS   Healthy Mindset (Psychosocial):  Focused Goals, Sustainable Changes Clinical staff led group instruction and group discussion with PowerPoint presentation and patient guidebook. To enhance the learning environment the use of posters, models and videos may be added. Patients will be able to apply effective goal setting strategies to establish at least one personal goal, and then take consistent, meaningful action toward that goal. They will learn to identify common barriers to achieving personal goals and develop strategies to overcome them. Patients will also gain an understanding of how our mind-set can impact our ability to achieve goals and the importance of cultivating a positive and growth-oriented mind-set. The purpose of this lesson is to provide patients with a deeper understanding of how to set and achieve personal goals, as well as the tools and strategies needed  to overcome common obstacles which may arise along the way.  From Head to Heart: The Power of a Healthy Outlook  Clinical staff led group instruction and group discussion with PowerPoint presentation and patient guidebook. To enhance the learning environment the use of posters, models and videos may be added. Patients will be able to recognize and describe the impact of emotions and mood on physical health. They will discover the importance of self-care and explore self-care practices which may work for them. Patients will also learn how to utilize the 4 C's to cultivate a healthier outlook and better manage stress and challenges. The purpose of this lesson is to demonstrate to patients how a healthy outlook is an essential part of maintaining good health, especially as they continue their cardiac rehab journey.  Healthy Sleep for a Healthy Heart Clinical staff led group instruction and group discussion with PowerPoint presentation and patient guidebook. To enhance the learning environment the use of posters, models and videos may be added. At the conclusion of this workshop, patients will be able to demonstrate knowledge of the importance of sleep to overall health, well-being, and quality of life. They will understand the symptoms of, and treatments for, common sleep disorders. Patients will also be able to identify daytime and nighttime behaviors which impact sleep, and they will be able to apply these tools to help manage sleep-related challenges. The purpose of this lesson is to provide patients with a general overview of sleep and outline the importance of quality sleep. Patients will learn about a few of the most common sleep disorders. Patients will also be introduced to the concept of "sleep hygiene," and discover ways to self-manage certain sleeping problems through simple daily behavior changes. Finally, the workshop will motivate patients by clarifying the links between quality sleep and their  goals of heart-healthy living.   Recognizing and Reducing Stress Clinical staff led group instruction and group discussion with PowerPoint presentation and patient guidebook. To enhance the learning environment the use of posters, models and videos may be added. At the conclusion of this workshop, patients will be able to understand the types of stress reactions, differentiate between acute and chronic stress, and recognize the impact that chronic stress has on their health. They will also be able to apply different coping mechanisms, such as reframing negative self-talk. Patients will have the opportunity to practice a variety of stress management techniques,  such as deep abdominal breathing, progressive muscle relaxation, and/or guided imagery.  The purpose of this lesson is to educate patients on the role of stress in their lives and to provide healthy techniques for coping with it.  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   Learning Preferences Video          Education Topics:  Knowledge Questionnaire Score:  Knowledge Questionnaire Score - 11/10/23 0852       Knowledge Questionnaire Score   Post Score 19/24          Core Components/Risk Factors/Patient Goals at Admission:  Personal Goals and Risk Factors at Admission - 09/15/23 0940       Core Components/Risk Factors/Patient Goals on Admission   Hypertension Yes    Intervention Provide education on lifestyle modifcations including regular physical activity/exercise, weight management, moderate sodium restriction and increased consumption of fresh fruit, vegetables, and low fat dairy, alcohol moderation, and smoking cessation.;Monitor prescription use compliance.    Expected Outcomes Short Term: Continued assessment and intervention until BP is < 140/48mm HG in  hypertensive participants. < 130/33mm HG in hypertensive participants with diabetes, heart failure or chronic kidney disease.;Long Term: Maintenance of blood pressure at goal levels.    Stress Yes    Intervention Offer individual and/or small group education and counseling on adjustment to heart disease, stress management and health-related lifestyle change. Teach and support self-help strategies.;Refer participants experiencing significant psychosocial distress to appropriate mental health specialists for further evaluation and treatment. When possible, include family members and significant others in education/counseling sessions.    Expected Outcomes Short Term: Participant demonstrates changes in health-related behavior, relaxation and other stress management skills, ability to obtain effective social support, and compliance with psychotropic medications if prescribed.;Long Term: Emotional wellbeing is indicated by absence of clinically significant psychosocial distress or social isolation.          Core Components/Risk Factors/Patient Goals Review:   Goals and Risk Factor Review     Row Name 09/29/23 0901 11/10/23 1743           Core Components/Risk Factors/Patient Goals Review   Personal Goals Review Hypertension;Lipids;Stress;Weight Management/Obesity Hypertension;Lipids;Stress;Weight Management/Obesity      Review Ashley Collier started cardiac rehab on 09/29/23. Ashley Collier has increased her time on the nustep from 10 minutes to 15 minutes. vital sings and CBG's have been stable. Ashley Collier continues to do well with exercise at cardiac rehab. vital sings and CBG's have been stable. Ashley Collier has increased her time on the nustep to 30 minutes. Ashley Collier will complete cardiac rehab on 11/16/23.      Expected Outcomes Ashley Collier will continue to particpate in cardiac rehab for exercise, nutrition and lifestyle modifications Ashley Collier will continue to particpate in cardiac rehab for exercise, nutrition and lifestyle modifications          Core Components/Risk Factors/Patient Goals at Discharge (Final Review):   Goals and Risk Factor Review - 11/10/23 1743       Core Components/Risk Factors/Patient Goals Review   Personal Goals Review Hypertension;Lipids;Stress;Weight Management/Obesity    Review Ashley Collier continues to do well with exercise at cardiac rehab. vital sings and CBG's have been stable. Ashley Collier has increased her time on the nustep to 30 minutes. Ashley Collier will complete cardiac rehab on 11/16/23.    Expected Outcomes Ashley Collier will continue to particpate in cardiac rehab for exercise, nutrition and lifestyle modifications  ITP Comments:  ITP Comments     Row Name 09/15/23 0820 09/29/23 0843 11/10/23 1740       ITP Comments Medical Director- Dr. Wilbert Bihari, MD. Introduction to the Cardiac Rehab Program. Review of intital orientation folder. 30 Day ITP Review. Ashley Collier started cardiac rehab on 09/21/23. Ashley Collier had poor exercise tolerance. Ashley Collier has since improved and is getting stronger. 30 Day ITP Review. Dajon has good attendance and participation with exercise at  cardiac rehab. Ashley Collier will complete cardiac rehab on 11/16/23.        Comments: See ITP comments.Hadassah Elpidio Quan RN BSN

## 2023-11-11 ENCOUNTER — Encounter (HOSPITAL_COMMUNITY)
Admission: RE | Admit: 2023-11-11 | Discharge: 2023-11-11 | Disposition: A | Discharge status: Home / Self Care | Source: Ambulatory Visit | Attending: Cardiology | Admitting: Cardiology

## 2023-11-11 DIAGNOSIS — I10 Essential (primary) hypertension: Secondary | ICD-10-CM | POA: Diagnosis not present

## 2023-11-11 DIAGNOSIS — Z955 Presence of coronary angioplasty implant and graft: Secondary | ICD-10-CM

## 2023-11-16 ENCOUNTER — Encounter (HOSPITAL_COMMUNITY)
Admission: RE | Admit: 2023-11-16 | Discharge: 2023-11-16 | Disposition: A | Discharge status: Home / Self Care | Source: Ambulatory Visit | Attending: Cardiology

## 2023-11-16 DIAGNOSIS — E118 Type 2 diabetes mellitus with unspecified complications: Secondary | ICD-10-CM

## 2023-11-16 DIAGNOSIS — E785 Hyperlipidemia, unspecified: Secondary | ICD-10-CM

## 2023-11-16 DIAGNOSIS — I10 Essential (primary) hypertension: Secondary | ICD-10-CM | POA: Diagnosis not present

## 2023-11-16 DIAGNOSIS — Z955 Presence of coronary angioplasty implant and graft: Secondary | ICD-10-CM

## 2023-11-16 NOTE — Progress Notes (Signed)
 Discharge Progress Report  Patient Details  Name: Ashley Collier MRN: [id number] Date of Birth: [birth year] Referring Provider:   Flowsheet Row CARDIAC REHAB PHASE II ORIENTATION from 09/15/2023 in [facility]  Referring Provider Fredrica Lipoma, MD ([facility])  Leldon, Wilbert SAUNDERS, MD (covering)]     Number of Visits: 36  Reason for Discharge:  Patient reached a stable level of exercise. Patient has met program and personal goals.  Smoking History:  Social History   Tobacco Use  Smoking Status Never  Smokeless Tobacco Never    Diagnosis:  06/30/23 DES LAD, 07/27/23 DES LCx - Plan: Amb Referral To Provider Referral Exercise Program (P.R.E.P)  DM type 2, controlled, with complication (HCC) - Plan: Amb Referral To Provider Referral Exercise Program (P.R.E.P)  Hyperlipidemia with target low density lipoprotein (LDL) cholesterol less than 100 mg/dL - Plan: Amb Referral To Provider Referral Exercise Program (P.R.E.P)  Essential hypertension - Plan: Amb Referral To Provider Referral Exercise Program (P.R.E.P)  ADL UCSD:   Initial Exercise Prescription:  Initial Exercise Prescription - 09/15/23 1300       Date of Initial Exercise RX and Referring Provider   Date 09/15/23    Referring Provider Fredrica Lipoma, MD [place])   Shlomo Wilbert SAUNDERS, MD (covering)   Expected Discharge Date 11/16/23      NuStep   Level 1    SPM 75    Minutes 30    METs 1.6      Prescription Details   Frequency (times per week) 2    Duration Progress to 30 minutes of continuous aerobic without signs/symptoms of physical distress      Intensity   THRR 40-80% of Max Heartrate 60-119    Ratings of Perceived Exertion 11-13    Perceived Dyspnea 0-4      Progression   Progression Continue to progress workloads to maintain intensity without signs/symptoms of physical distress.      Resistance Training   Training Prescription Yes    Weight 2  lbs    Reps 10-15          Discharge Exercise Prescription (Final Exercise Prescription Changes):  Exercise Prescription Changes - 11/16/23 1600       Response to Exercise   Blood Pressure (Admit) 140/72    Blood Pressure (Exit) 140/74    Heart Rate (Admit) 62 bpm    Heart Rate (Exercise) 86 bpm    Heart Rate (Exit) 69 bpm    Rating of Perceived Exertion (Exercise) 13    Symptoms None    Comments Pt graduated from the CRP2 program today    Duration Continue with 30 min of aerobic exercise without signs/symptoms of physical distress.    Intensity THRR unchanged      Progression   Progression Continue to progress workloads to maintain intensity without signs/symptoms of physical distress.    Average METs 3.8      Resistance Training   Training Prescription Yes    Weight 2 lbs    Reps 10-15    Time 5 Minutes      Interval Training   Interval Training No      NuStep   Level 2    SPM 106    Minutes 30    METs 3.8      Home Exercise Plan   Plans to continue exercise at Home (comment)    Frequency Add 4 additional days to program exercise sessions.    Initial  Home Exercises Provided 11/11/23          Functional Capacity:  6 Minute Walk     Row Name 09/15/23 1014 11/09/23 0920       6 Minute Walk   Phase Initial Discharge    Distance 691 feet 1125 feet    Distance % Change -- 62.8 %    Distance Feet Change -- 434 ft    Walk Time 4.58 minutes 6 minutes    # of Rest Breaks 1  Seated rest at 4 mins 35 seconds til end of test. 0    MPH 1.71 2.13    METS 1.62 2.3    RPE 15 13    Perceived Dyspnea  2 0    VO2 Peak 5.66 8.1    Symptoms Yes (comment) No    Comments Shortness of breath, dizzy, lightheaded. --    Resting HR 60 bpm 74 bpm    Resting BP 152/62 144/78    Resting Oxygen Saturation  99 % --    Exercise Oxygen Saturation  during 6 min walk 100 % --    Max Ex. HR 78 bpm 80 bpm    Max Ex. BP 162/70 156/72    2 Minute Post BP 142/62 --        Psychological, QOL, Others - Outcomes: PHQ 2/9:    11/16/2023   10:00 AM 09/15/2023    1:03 PM  Depression screen PHQ 2/9  Decreased Interest 0 0  Down, Depressed, Hopeless 0 0  PHQ - 2 Score 0 0  Altered sleeping 0 1  Tired, decreased energy 0 1  Change in appetite 0 1  Feeling bad or failure about yourself  0 0  Trouble concentrating 0 1  Moving slowly or fidgety/restless 0 1  Suicidal thoughts 0 0  PHQ-9 Score 0 5  Difficult doing work/chores Not difficult at all Somewhat difficult    Quality of Life:  Quality of Life - 11/10/23 0851       Quality of Life Scores   Health/Function Pre 21.93 %    Health/Function Post 26.6 %    Health/Function % Change 21.3 %    Socioeconomic Pre 29.64 %    Socioeconomic Post 27.43 %    Socioeconomic % Change  -7.46 %    Psych/Spiritual Pre 26.57 %    Psych/Spiritual Post 30 %    Psych/Spiritual % Change 12.91 %    Family Pre 30 %    Family Post 26.4 %    Family % Change -12 %    GLOBAL Pre 25.66 %    GLOBAL Post 27.44 %    GLOBAL % Change 6.94 %          Personal Goals: Goals established at orientation with interventions provided to work toward goal.  Personal Goals and Risk Factors at Admission - 09/15/23 0940       Core Components/Risk Factors/Patient Goals on Admission   Hypertension Yes    Intervention Provide education on lifestyle modifcations including regular physical activity/exercise, weight management, moderate sodium restriction and increased consumption of fresh fruit, vegetables, and low fat dairy, alcohol moderation, and smoking cessation.;Monitor prescription use compliance.    Expected Outcomes Short Term: Continued assessment and intervention until BP is < 140/74mm HG in hypertensive participants. < 130/90mm HG in hypertensive participants with diabetes, heart failure or chronic kidney disease.;Long Term: Maintenance of blood pressure at goal levels.    Stress Yes    Intervention Offer individual and/or  small group education and counseling on adjustment to heart disease, stress management and health-related lifestyle change. Teach and support self-help strategies.;Refer participants experiencing significant psychosocial distress to appropriate mental health specialists for further evaluation and treatment. When possible, include family members and significant others in education/counseling sessions.    Expected Outcomes Short Term: Participant demonstrates changes in health-related behavior, relaxation and other stress management skills, ability to obtain effective social support, and compliance with psychotropic medications if prescribed.;Long Term: Emotional wellbeing is indicated by absence of clinically significant psychosocial distress or social isolation.           Personal Goals Discharge:  Goals and Risk Factor Review     Row Name 09/29/23 0901 11/10/23 1743           Core Components/Risk Factors/Patient Goals Review   Personal Goals Review Hypertension;Lipids;Stress;Weight Management/Obesity Hypertension;Lipids;Stress;Weight Management/Obesity      Review Charlee started cardiac rehab on 09/29/23. Najwa has increased her time on the nustep from 10 minutes to 15 minutes. vital sings and CBG's have been stable. Carilyn continues to do well with exercise at cardiac rehab. vital sings and CBG's have been stable. Rhonda has increased her time on the nustep to 30 minutes. Sheretha will complete cardiac rehab on 11/16/23.      Expected Outcomes [place] will continue to particpate in cardiac rehab for exercise, nutrition and lifestyle modifications Fianna will continue to particpate in cardiac rehab for exercise, nutrition and lifestyle modifications         Exercise Goals and Review:  Exercise Goals     Row Name 09/15/23 0940             Exercise Goals   Increase Physical Activity Yes       Intervention Provide advice, education, support and counseling about physical activity/exercise  needs.;Develop an individualized exercise prescription for aerobic and resistive training based on initial evaluation findings, risk stratification, comorbidities and participant's personal goals.       Expected Outcomes Short Term: Attend rehab on a regular basis to increase amount of physical activity.;Long Term: Exercising regularly at least 3-5 days a week.;Long Term: Add in home exercise to make exercise part of routine and to increase amount of physical activity.       Increase Strength and Stamina Yes       Intervention Provide advice, education, support and counseling about physical activity/exercise needs.;Develop an individualized exercise prescription for aerobic and resistive training based on initial evaluation findings, risk stratification, comorbidities and participant's personal goals.       Expected Outcomes Short Term: Increase workloads from initial exercise prescription for resistance, speed, and METs.;Short Term: Perform resistance training exercises routinely during rehab and add in resistance training at home;Long Term: Improve cardiorespiratory fitness, muscular endurance and strength as measured by increased METs and functional capacity ( )       Able to understand and use rate of perceived exertion (RPE) scale Yes       Intervention Provide education and explanation on how to use RPE scale       Expected Outcomes Short Term: Able to use RPE daily in rehab to express subjective intensity level;Long Term:  Able to use RPE to guide intensity level when exercising independently       Knowledge and understanding of Target Heart Rate Range (THRR) Yes       Intervention Provide education and explanation of THRR including how the numbers were predicted and where they are located for reference  Expected Outcomes Short Term: Able to state/look up THRR;Long Term: Able to use THRR to govern intensity when exercising independently;Short Term: Able to use daily as guideline for  intensity in rehab       Understanding of Exercise Prescription Yes       Intervention Provide education, explanation, and written materials on patient's individual exercise prescription       Expected Outcomes Short Term: Able to explain program exercise prescription;Long Term: Able to explain home exercise prescription to exercise independently          Exercise Goals Re-Evaluation:  Exercise Goals Re-Evaluation     Row Name 09/21/23 1101 10/26/23 1015 11/16/23 1643         Exercise Goal Re-Evaluation   Exercise Goals Review Increase Physical Activity;Understanding of Exercise Prescription;Increase Strength and Stamina;Knowledge and understanding of Target Heart Rate Range (THRR);Able to understand and use rate of perceived exertion (RPE) scale Increase Physical Activity;Understanding of Exercise Prescription;Increase Strength and Stamina;Knowledge and understanding of Target Heart Rate Range (THRR);Able to understand and use rate of perceived exertion (RPE) scale Increase Physical Activity;Understanding of Exercise Prescription;Increase Strength and Stamina;Knowledge and understanding of Target Heart Rate Range (THRR);Able to understand and use rate of perceived exertion (RPE) scale     Comments Pt's first day in the CRP2 program. she understands the exercise Rx, RPE scale and THRR. Reviewed METs and goals with patient today. Pt is mkaing progress on her goal of walking everydat wtihout SOB. Thi has improved greatly per patient. She is walking 10-15 minutes 2x/day. Pt voices that she feels much better, so much so that she vaccumed the house; someting she has been unable to do for months. Pt graduated from the CRP2 program today. Pt made excellent progress. Pt achieved peak METs of 3.8 on today's session. Pt had been walking at home 3-4x/week in addtion to the cRP2 program. Pt plans to continue her exercise in the PREP program at the [facility].     Expected Outcomes Will continue to montior patient and  progress exercise workloads as tolerated. Will continue to montior patient and progress exercise workloads as tolerated. Pt will continue to exercise on her own at home by walking and at the [facility] in the PREP program.        Nutrition & Weight - Outcomes:  Pre Biometrics - 09/15/23 0820       Pre Biometrics   Waist Circumference 39.5 inches    Hip Circumference 42 inches    Waist to Hip Ratio 0.94 %    Triceps Skinfold 19 mm    % Body Fat 39.3 %    Grip Strength 8 kg    Flexibility 12.13 in    Single Leg Stand 11.87 seconds          Post Biometrics - 11/09/23 0930        Post  Biometrics   Height 4' 11.5 (1.511 m)    Weight 64.9 kg    Waist Circumference 41 inches    Hip Circumference 43 inches    Waist to Hip Ratio 0.95 %    BMI (Calculated) 28.43    Triceps Skinfold 19 mm    % Body Fat 40.5 %    Grip Strength 16 kg    Flexibility 16.5 in    Single Leg Stand 6.12 seconds          Nutrition:  Nutrition Therapy & Goals - 11/16/23 1043       Nutrition Therapy   Diet Heart Healthy  Diet      Personal Nutrition Goals   Nutrition Goal Patient to identify strategies for reducing cardiovascular risk by attending the [facility] education and nutrition series weekly.   goal met.   Personal Goal #2 Patient to improve diet quality by using the plate method as a guide for meal planning to include lean protein/plant protein, fruits, vegetables, whole grains, nonfat dairy as part of a well-balanced diet.   goal in actionl.   Comments Goals in action. Patient has medical history of HTN, Hyperlipidemia, DM2, CAD, congestive heart failure. 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 She continues to follow a high protein diet and reports adequate restriction following bariatric surgery.  A1c is in a normal range; she continues metformin. Lipids are at goal. We have discussed protein sources/protein goals as well. She is up 5.5# since starting with our  program. Patient will benefit from adherence to nutrition, exercise, and lifestyle modification.      Intervention Plan   Intervention Prescribe, educate and counsel regarding individualized specific dietary modifications aiming towards targeted core components such as weight, hypertension, lipid management, diabetes, heart failure and other comorbidities.;Nutrition handout(s) given to patient.    Expected Outcomes Short Term Goal: Understand basic principles of dietary content, such as calories, fat, sodium, cholesterol and nutrients.;Long Term Goal: Adherence to prescribed nutrition plan.          Nutrition Discharge:  Nutrition Assessments - 09/24/23 1417       Rate Your Plate Scores   Pre Score 62          Education Questionnaire Score:  Knowledge Questionnaire Score - 11/10/23 0852       Knowledge Questionnaire Score   Post Score 19/24          Goals reviewed with patient; copy given to patient. Kristalyn graduates from  Intensive/Traditional cardiac rehab program on 11/16/23  with completion of  18  exercise and  18 education sessions. Pt maintained good attendance and progressed nicely during their participation in rehab as evidenced by increased MET level. Fatimata increased her distance on her post exercise walk test by 434 feet.   Medication list reconciled. Repeat  PHQ score-  0.  Pt has made significant lifestyle changes and should be commended for her  success. [place]  achieved her  goals during cardiac rehab.   Pt plans to continue exercise at  the prep program. We are proud of Ayme's progress. Florie says that she feels much better since she has participated in the program.Weda Baumgarner Elpidio Quan RN BSN

## 2023-11-18 ENCOUNTER — Encounter (HOSPITAL_COMMUNITY)

## 2023-11-20 ENCOUNTER — Telehealth (HOSPITAL_COMMUNITY): Payer: Self-pay

## 2023-11-20 NOTE — Telephone Encounter (Signed)
 Called and spoke with patient to inform her that she did not qualify for the PREP program because her MD and PCP are out of the [place] network. Pt voiced understanding and appreciated the call. The patient does have plans to go the the [facility] next week and begin her exercise there post the CRP2 program.   Alm Parkins MS, ACSM-CEP, CCRP

## 2023-11-23 ENCOUNTER — Encounter (HOSPITAL_COMMUNITY)

## 2023-11-25 ENCOUNTER — Encounter (HOSPITAL_COMMUNITY)

## 2023-12-02 ENCOUNTER — Encounter (HOSPITAL_COMMUNITY)

## 2023-12-07 ENCOUNTER — Encounter (HOSPITAL_COMMUNITY)

## 2023-12-09 ENCOUNTER — Encounter (HOSPITAL_COMMUNITY)
